# Patient Record
Sex: Female | Born: 1955 | Race: Black or African American | Hispanic: No | Marital: Married | Smoking: Never smoker
Health system: Southern US, Community
[De-identification: ages and names within clinical notes are randomized; demographics above are authoritative.]

## PROBLEM LIST (undated history)

## (undated) DIAGNOSIS — Z800 Family history of malignant neoplasm of digestive organs: Secondary | ICD-10-CM

## (undated) DIAGNOSIS — K5900 Constipation, unspecified: Secondary | ICD-10-CM

## (undated) DIAGNOSIS — I10 Essential (primary) hypertension: Secondary | ICD-10-CM

## (undated) DIAGNOSIS — R001 Bradycardia, unspecified: Secondary | ICD-10-CM

## (undated) DIAGNOSIS — K589 Irritable bowel syndrome without diarrhea: Secondary | ICD-10-CM

## (undated) DIAGNOSIS — R233 Spontaneous ecchymoses: Secondary | ICD-10-CM

## (undated) DIAGNOSIS — M2540 Effusion, unspecified joint: Secondary | ICD-10-CM

## (undated) DIAGNOSIS — F32A Depression, unspecified: Secondary | ICD-10-CM

## (undated) DIAGNOSIS — E669 Obesity, unspecified: Secondary | ICD-10-CM

## (undated) DIAGNOSIS — F329 Major depressive disorder, single episode, unspecified: Secondary | ICD-10-CM

## (undated) HISTORY — DX: Obesity, unspecified: E66.9

## (undated) HISTORY — PX: TUBAL LIGATION: SHX77

## (undated) HISTORY — DX: Constipation, unspecified: K59.00

## (undated) HISTORY — DX: Essential (primary) hypertension: I10

## (undated) HISTORY — DX: Spontaneous ecchymoses: R23.3

## (undated) HISTORY — PX: ABDOMINAL HYSTERECTOMY: SHX81

## (undated) HISTORY — PX: CHOLECYSTECTOMY: SHX55

## (undated) HISTORY — DX: Bradycardia, unspecified: R00.1

## (undated) HISTORY — DX: Family history of malignant neoplasm of digestive organs: Z80.0

## (undated) HISTORY — DX: Depression, unspecified: F32.A

## (undated) HISTORY — DX: Irritable bowel syndrome, unspecified: K58.9

## (undated) HISTORY — DX: Effusion, unspecified joint: M25.40

---
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
## 1983-05-21 HISTORY — PX: BREAST EXCISIONAL BIOPSY: SUR124

## 1997-11-04 ENCOUNTER — Ambulatory Visit (HOSPITAL_COMMUNITY): Admission: RE | Admit: 1997-11-04 | Discharge: 1997-11-04 | Payer: Self-pay | Admitting: Internal Medicine

## 1997-11-23 ENCOUNTER — Emergency Department (HOSPITAL_COMMUNITY): Admission: EM | Admit: 1997-11-23 | Discharge: 1997-11-23 | Payer: Self-pay | Admitting: Emergency Medicine

## 1998-04-21 ENCOUNTER — Other Ambulatory Visit: Admission: RE | Admit: 1998-04-21 | Discharge: 1998-04-21 | Payer: Self-pay | Admitting: Obstetrics & Gynecology

## 1998-08-16 ENCOUNTER — Emergency Department (HOSPITAL_COMMUNITY): Admission: EM | Admit: 1998-08-16 | Discharge: 1998-08-17 | Payer: Self-pay | Admitting: Emergency Medicine

## 1998-10-06 ENCOUNTER — Ambulatory Visit (HOSPITAL_COMMUNITY): Admission: RE | Admit: 1998-10-06 | Discharge: 1998-10-06 | Payer: Self-pay | Admitting: Internal Medicine

## 1998-10-06 ENCOUNTER — Encounter: Payer: Self-pay | Admitting: Internal Medicine

## 1999-11-09 ENCOUNTER — Encounter: Payer: Self-pay | Admitting: *Deleted

## 1999-11-10 ENCOUNTER — Inpatient Hospital Stay (HOSPITAL_COMMUNITY): Admission: EM | Admit: 1999-11-10 | Discharge: 1999-11-12 | Payer: Self-pay | Admitting: *Deleted

## 1999-11-11 ENCOUNTER — Encounter: Payer: Self-pay | Admitting: Internal Medicine

## 2000-11-28 ENCOUNTER — Encounter: Payer: Self-pay | Admitting: Internal Medicine

## 2000-11-28 ENCOUNTER — Ambulatory Visit (HOSPITAL_COMMUNITY): Admission: RE | Admit: 2000-11-28 | Discharge: 2000-11-28 | Payer: Self-pay | Admitting: Internal Medicine

## 2002-06-09 ENCOUNTER — Encounter: Admission: RE | Admit: 2002-06-09 | Discharge: 2002-06-09 | Payer: Self-pay | Admitting: Internal Medicine

## 2002-06-09 ENCOUNTER — Encounter: Payer: Self-pay | Admitting: Internal Medicine

## 2003-01-31 ENCOUNTER — Encounter: Payer: Self-pay | Admitting: Internal Medicine

## 2003-01-31 ENCOUNTER — Encounter (INDEPENDENT_AMBULATORY_CARE_PROVIDER_SITE_OTHER): Payer: Self-pay | Admitting: Specialist

## 2003-01-31 ENCOUNTER — Encounter: Admission: RE | Admit: 2003-01-31 | Discharge: 2003-01-31 | Payer: Self-pay | Admitting: Internal Medicine

## 2003-02-16 ENCOUNTER — Encounter: Admission: RE | Admit: 2003-02-16 | Discharge: 2003-02-16 | Payer: Self-pay | Admitting: Internal Medicine

## 2003-02-16 ENCOUNTER — Encounter: Payer: Self-pay | Admitting: Internal Medicine

## 2003-03-18 ENCOUNTER — Encounter: Admission: RE | Admit: 2003-03-18 | Discharge: 2003-03-18 | Payer: Self-pay | Admitting: Internal Medicine

## 2003-05-30 ENCOUNTER — Encounter: Admission: RE | Admit: 2003-05-30 | Discharge: 2003-05-30 | Payer: Self-pay | Admitting: Internal Medicine

## 2003-06-22 ENCOUNTER — Encounter: Admission: RE | Admit: 2003-06-22 | Discharge: 2003-06-22 | Payer: Self-pay | Admitting: Internal Medicine

## 2003-07-29 ENCOUNTER — Encounter: Admission: RE | Admit: 2003-07-29 | Discharge: 2003-07-29 | Payer: Self-pay | Admitting: Internal Medicine

## 2004-02-01 ENCOUNTER — Encounter: Admission: RE | Admit: 2004-02-01 | Discharge: 2004-02-01 | Payer: Self-pay | Admitting: Internal Medicine

## 2004-02-09 ENCOUNTER — Encounter: Admission: RE | Admit: 2004-02-09 | Discharge: 2004-02-09 | Payer: Self-pay | Admitting: Internal Medicine

## 2005-01-30 ENCOUNTER — Encounter: Admission: RE | Admit: 2005-01-30 | Discharge: 2005-01-30 | Payer: Self-pay | Admitting: Internal Medicine

## 2006-02-07 ENCOUNTER — Encounter: Admission: RE | Admit: 2006-02-07 | Discharge: 2006-02-07 | Payer: Self-pay | Admitting: Obstetrics and Gynecology

## 2006-11-02 ENCOUNTER — Emergency Department: Payer: Self-pay | Admitting: Emergency Medicine

## 2007-02-10 ENCOUNTER — Encounter: Admission: RE | Admit: 2007-02-10 | Discharge: 2007-02-10 | Payer: Self-pay | Admitting: Internal Medicine

## 2008-02-11 ENCOUNTER — Encounter: Admission: RE | Admit: 2008-02-11 | Discharge: 2008-02-11 | Payer: Self-pay | Admitting: Obstetrics and Gynecology

## 2008-06-25 ENCOUNTER — Emergency Department (HOSPITAL_COMMUNITY): Admission: EM | Admit: 2008-06-25 | Discharge: 2008-06-25 | Payer: Self-pay | Admitting: Emergency Medicine

## 2009-12-01 ENCOUNTER — Encounter: Admission: RE | Admit: 2009-12-01 | Discharge: 2009-12-01 | Payer: Self-pay | Admitting: Internal Medicine

## 2010-06-10 ENCOUNTER — Encounter: Payer: Self-pay | Admitting: Internal Medicine

## 2010-09-04 LAB — BASIC METABOLIC PANEL
BUN: 9 mg/dL (ref 6–23)
CO2: 31 mEq/L (ref 19–32)
Calcium: 10 mg/dL (ref 8.4–10.5)
Chloride: 103 mEq/L (ref 96–112)
Creatinine, Ser: 0.69 mg/dL (ref 0.4–1.2)
GFR calc Af Amer: >60 mL/min (ref >60)
GFR calc non Af Amer: >60 mL/min (ref >60)
Glucose, Bld: 98 mg/dL (ref 70–99)
Potassium: 3.7 mEq/L (ref 3.5–5.1)
Sodium: 140 mEq/L (ref 135–145)

## 2010-09-04 LAB — CBC
HCT: 38.4 % (ref 36.0–46.0)
Hemoglobin: 12.9 g/dL (ref 12.0–15.0)
MCHC: 33.5 g/dL (ref 30.0–36.0)
MCV: 92.2 fL (ref 78.0–100.0)
Platelets: 192 10*3/uL (ref 150–400)
RBC: 4.17 MIL/uL (ref 3.87–5.11)
RDW: 13.5 % (ref 11.5–15.5)
WBC: 6.6 10*3/uL (ref 4.0–10.5)

## 2010-09-04 LAB — DIFFERENTIAL
Basophils Absolute: 0 10*3/uL (ref 0.0–0.1)
Basophils Relative: 0 % (ref 0–1)
Eosinophils Absolute: 0.1 10*3/uL (ref 0.0–0.7)
Eosinophils Relative: 1 % (ref 0–5)
Lymphocytes Relative: 24 % (ref 12–46)
Lymphs Abs: 1.6 10*3/uL (ref 0.7–4.0)
Monocytes Absolute: 0.8 10*3/uL (ref 0.1–1.0)
Monocytes Relative: 11 % (ref 3–12)
Neutro Abs: 4.2 10*3/uL (ref 1.7–7.7)
Neutrophils Relative %: 63 % (ref 43–77)

## 2010-09-04 LAB — CK TOTAL AND CKMB (NOT AT ARMC)
CK, MB: 2.3 ng/mL (ref 0.3–4.0)
Relative Index: 1.7 (ref 0.0–2.5)
Total CK: 133 U/L (ref 7–177)

## 2010-09-04 LAB — TROPONIN I: Troponin I: <0.01 ng/mL (ref 0.00–0.06)

## 2010-10-05 NOTE — Cardiovascular Report (Signed)
[place]. [facility]  Patient:    Ashley Conway, Ashley Conway                      MRN: [id number] Proc. Date: 11/12/99 Adm. Date:  [id number] Attending:  Rosanne Sack CC:         Marinda Elk, M.D.             Rosanne Sack, M.D.             Cardiac Catheterization Laboratory                        Cardiac Catheterization  CINE NUMBER:  [id number]  PROCEDURES PERFORMED:  Coronary angiography and left heart catheterization.  INDICATIONS:  Atypical chest pain in conjunction with an abnormal Cardiolite study (low ejection fraction and possible anterior ischemia).  DESCRIPTION OF PROCEDURE:  After informed consent, a 6 French sheath was inserted into the right femoral artery using a modified Seldinger technique. A 6 French A2 multipurpose catheter was used for hemodynamic recordings, left ventriculography, and selective right and left coronary angiography.  The patient tolerated the procedure without complications.  RESULTS:  I:  HEMODYNAMIC DATA:     a. The aortic pressure was 174/91 mmHg.     b. Left ventricular pressure 174/14 mmHg.  II:  LEFT VENTRICULOGRAPHY:  Left ventriculography by power injection resulted in a ventricular tachy gram.  Hand injection subsequently demonstrated symmetrical LV wall motion and an estimated ejection fraction in the 55-65% range.  No mitral regurgitation was noted.  III:  SELECTIVE CORONARY ANGIOGRAPHY:     a. Left main:  The left main coronary artery is normal.     b. Left anterior descending coronary artery:  The left anterior        descending coronary artery is a large vessel that wraps around        the left ventricular apex.  It gives origin to three diagonal branches,        all of which were normal.  The LAD is normal.     c. Circumflex artery:  The circumflex artery is large giving origin        to three obtuse marginal branches and is normal.     d. Ramus intermedius branch:  A large ramus branch arises  from the left        main and is normal.     e. Right coronary artery:  The right coronary artery is normal.  CONCLUSIONS: 1. Normal coronary arteries. 2. Normal left ventricular function. 3. Abnormal Cardiolite, false-positive.  RECOMMENDATIONS:  No further cardiac evaluation. DD:  11/12/99 TD:  11/13/99 Job: [id number]

## 2010-10-05 NOTE — H&P (Signed)
[place]. [facility]  Patient:    Ashley, Conway                      MRN: [id number] Adm. Date:  [id number] Attending:  Lenora Boys                         History and Physical  DATE OF BIRTH: [birth year]  PROBLEM:  Chest pain.  HISTORY OF PRESENT ILLNESS:  The patient is a 55 year old married black female who had onset of chest pain at 9:30 on evening of admission.  She was riding in a car with no exertion.  Pain was 7 out of 10 in intensity, dull and left precordial.  She had a little radiation toward the right, but none toward the arm or neck.  Pain was severe for about 10 minutes, but persisted for 2-1/2 hours and her husband brought her to the emergency room for evaluation.  She had some associated sweats which she cannot be sure were not a hot flash, but had no associated shortness of breath or palpitations.  She has had similar pains before, but not as severe or prolonged.  CURRENT MEDICATIONS:  Senestin for HRT.  ALLERGIES:  None.  PAST MEDICAL HISTORY:  She has had diagnosis of irritable bowel syndrome hospitalized in 1989, gallbladder removal in 1998, hysterectomy for bleeding.  FAMILY HISTORY:  Mother died at 38 of a massive heart attack.  Father had hypertension and had a heart attack and died at 37.  The patient has a 107 year old sister with hypertension, 36 year old sister alive and well, 79 year old sister with hypertension, a twin 28 year old brother who is being treated for hypertension and 61- and 34 year old brothers both with hypertension.  Five of seven children in her family are under treatment for hypertension.  No cancer in family.  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  She walks for exercise once a week or more.  Nonsmoker.  No ETOH.  No cocaine use.  REVIEW OF SYSTEMS:  The patient does get headaches and has some trouble reading, more so recently.  ENT:  Review negative.  GI:  Some  intermittent abdominal pain and alternating constipation/diarrhea, more on the constipated side.  MS:  Does get joint pains without swelling or redness.  SKIN: Negative.  PSYCHOLOGICAL:  Negative.  PHYSICAL EXAMINATION:  VITAL SIGNS:  Temperature 97.5, pulse 53, respirations 22, blood pressure 173/101.  CONSTITUTIONAL:  The patient is in no acute distress.  HEENT:  Eyes:  EOMI, PERL.  ENT:  Normal EACs and TMs.  Oral mucosa normal.  NECK:  Supple.  No thyromegaly or bruits.  CHEST:  Clear, nontender.  BREASTS:  Without masses or tenderness.  HEART:  Heart rate regular.  No murmur, rub, or gallop.  The heart sounds are normal.  ABDOMEN:  Mildly obese, nontender.  No HSM or masses.  GENITAL:  Deferred.  RECTAL:  Deferred.  EXTREMITIES:  No clubbing, cyanosis, or edema.  Pulses were difficult to feel in the popliteal space and right DP, but did feel DP on left and posterior tibials bilaterally.  SKIN:  Clear.  LABORATORY DATA:  EKG showed sinus bradycardia with no ischemic changes.  CBC:  WBC 5.8, hemoglobin 11.7.  Chemistry:  Normal with sodium 143, potassium 3.5, creatinine 0.9 and glucose 103.  Initial CK 206 with MB 3.0. Initial troponin I 0.05.  ASSESSMENT:  Chest pain, will need  to rule out myocardial infarction given her family history especially with mothers premature heart attack.  Will get serial enzymes and EKG.  If all are negative, patient will be cleared for follow up with her primary care physician in [place]. DD:  11/10/99 TD:  11/10/99 Job: [id number]

## 2010-11-27 ENCOUNTER — Other Ambulatory Visit: Payer: Self-pay | Admitting: Internal Medicine

## 2010-11-27 DIAGNOSIS — Z1231 Encounter for screening mammogram for malignant neoplasm of breast: Secondary | ICD-10-CM

## 2010-12-05 ENCOUNTER — Ambulatory Visit
Admission: RE | Admit: 2010-12-05 | Discharge: 2010-12-05 | Disposition: A | Discharge status: Home / Self Care | Payer: Self-pay | Source: Ambulatory Visit | Attending: Internal Medicine | Admitting: Internal Medicine

## 2010-12-05 DIAGNOSIS — Z1231 Encounter for screening mammogram for malignant neoplasm of breast: Secondary | ICD-10-CM

## 2011-05-30 ENCOUNTER — Emergency Department (HOSPITAL_COMMUNITY)
Admission: EM | Admit: 2011-05-30 | Discharge: 2011-05-30 | Disposition: A | Discharge status: Home / Self Care | Payer: Self-pay | Attending: Emergency Medicine | Admitting: Emergency Medicine

## 2011-05-30 ENCOUNTER — Encounter (HOSPITAL_COMMUNITY): Payer: Self-pay | Admitting: *Deleted

## 2011-05-30 DIAGNOSIS — L408 Other psoriasis: Secondary | ICD-10-CM | POA: Insufficient documentation

## 2011-05-30 DIAGNOSIS — L409 Psoriasis, unspecified: Secondary | ICD-10-CM

## 2011-05-30 DIAGNOSIS — I10 Essential (primary) hypertension: Secondary | ICD-10-CM | POA: Insufficient documentation

## 2011-05-30 HISTORY — DX: Essential (primary) hypertension: I10

## 2011-05-30 MED ORDER — PREDNISONE 10 MG PO TABS: 10.0000 mg | ORAL_TABLET | Freq: Two times a day (BID) | ORAL | Status: DC

## 2011-05-30 NOTE — ED Provider Notes (Signed)
Medical screening examination/treatment/procedure(s) were performed by non-physician practitioner and as supervising physician I was immediately available for consultation/collaboration.  Donnetta Hutching, MD 05/30/11 1529

## 2011-05-30 NOTE — ED Provider Notes (Signed)
History     CSN: [id number]  Arrival date & time 05/30/11  8469   First MD Initiated Contact with Patient 05/30/11 1038      Chief Complaint  Patient presents with  . Rash    (Consider location/radiation/quality/duration/timing/severity/associated sxs/prior treatment) HPI  Pt presents to the ED with complaints of rash. She has seen a Dermatologist twice a couple months ago for the rash but it has not alleviated. She was given triamcinolone cream and does not remember what she was diagnosed with. She states that the rash itches, then cracks, then burns afterwards. She denies systemic symptoms such as weight loss, fevers, chills, N/V/D. She is out of the cream she was using but does not believe that it helped.  pts past medical hx is positive for which she takes Lisinopril. No new/changes in medications.  Past Medical History  Diagnosis Date  . Hypertension     Past Surgical History  Procedure Date  . Tubal ligation   . Abdominal hysterectomy   . Cholecystectomy     No family history on file.  History  Substance Use Topics  . Smoking status: Never Smoker   . Smokeless tobacco: Not on file  . Alcohol Use: No    OB History    Grav Para Term Preterm Abortions TAB SAB Ect Mult Living                  Review of Systems  All other systems reviewed and are negative.    Allergies  Review of patient's allergies indicates no known allergies.  Home Medications   Current Outpatient Rx  Name Route Sig Dispense Refill  . LISINOPRIL-HYDROCHLOROTHIAZIDE 20-12.5 MG PO TABS Oral Take 1 tablet by mouth daily.    Marland Kitchen PREDNISONE 10 MG PO TABS Oral Take 1 tablet (10 mg total) by mouth 2 (two) times daily. 30 tablet 0    BP 136/76  Pulse 53  Temp(Src) 97.7 F (36.5 C) (Oral)  Resp 17  Wt 170 lb (77.111 kg)  SpO2 98%  Physical Exam  Nursing note and vitals reviewed. Constitutional: She appears well-developed and well-nourished.  HENT:  Head: Normocephalic and atraumatic.   Eyes: Conjunctivae are normal. Pupils are equal, round, and reactive to light.  Neck: Trachea normal, normal range of motion and full passive range of motion without pain. Neck supple.  Cardiovascular: Normal rate, regular rhythm and normal pulses.   Pulmonary/Chest: Effort normal and breath sounds normal. Chest wall is not dull to percussion. She exhibits no crepitus, no edema, no deformity and no retraction.  Abdominal: Soft. Normal appearance and bowel sounds are normal.  Musculoskeletal: Normal range of motion.  Lymphadenopathy:       Head (right side): No submental, no submandibular, no tonsillar, no preauricular, no posterior auricular and no occipital adenopathy present.       Head (left side): No submental, no submandibular, no tonsillar, no preauricular, no posterior auricular and no occipital adenopathy present.    She has no axillary adenopathy.  Neurological: She is alert. She has normal strength.  Skin: Skin is warm, dry and intact. Rash (rash pattern is on the anterior and post portion of the anticubital fossa. Rash found over the umbilicus, on the proxmal shin on the left leg. The skin is scaley and excoriated with darkening of pigmentation in the central portion. No central cleaning. ) noted.  Psychiatric: She has a normal mood and affect. Her speech is normal and behavior is normal. Judgment and thought content normal.  Cognition and memory are normal.  NO induration, tenderness of expression of puss to palpation.  ED Course  Procedures (including critical care time)  Labs Reviewed - No data to display No results found.   1. Psoriasis       MDM  Pt given a PO trial of prednisone and instructed to follow-up with Dermatology       Dorthula Matas, PA 05/30/11 1513

## 2011-05-30 NOTE — ED Notes (Signed)
Pt states "I have this rash on my left leg, went to the dermatologist, probably last Mar or April, have a rash to my arms, something trying to come up on my belly"

## 2011-12-28 ENCOUNTER — Emergency Department (HOSPITAL_COMMUNITY)
Admission: EM | Admit: 2011-12-28 | Discharge: 2011-12-28 | Disposition: A | Discharge status: Home / Self Care | Payer: Self-pay | Attending: Emergency Medicine | Admitting: Emergency Medicine

## 2011-12-28 ENCOUNTER — Emergency Department (HOSPITAL_COMMUNITY): Payer: Self-pay

## 2011-12-28 ENCOUNTER — Encounter (HOSPITAL_COMMUNITY): Payer: Self-pay | Admitting: Emergency Medicine

## 2011-12-28 DIAGNOSIS — Z79899 Other long term (current) drug therapy: Secondary | ICD-10-CM | POA: Insufficient documentation

## 2011-12-28 DIAGNOSIS — R109 Unspecified abdominal pain: Secondary | ICD-10-CM | POA: Insufficient documentation

## 2011-12-28 DIAGNOSIS — I10 Essential (primary) hypertension: Secondary | ICD-10-CM | POA: Insufficient documentation

## 2011-12-28 LAB — URINALYSIS, ROUTINE W REFLEX MICROSCOPIC
Glucose, UA: NEGATIVE mg/dL
Leukocytes, UA: NEGATIVE
Protein, ur: NEGATIVE mg/dL
Specific Gravity, Urine: 1.015 (ref 1.005–1.030)
Urobilinogen, UA: 1 mg/dL (ref 0.0–1.0)

## 2011-12-28 LAB — CBC WITH DIFFERENTIAL/PLATELET
Basophils Relative: 0 % (ref 0–1)
Eosinophils Absolute: 0.1 10*3/uL (ref 0.0–0.7)
Eosinophils Relative: 1 % (ref 0–5)
HCT: 39.9 % (ref 36.0–46.0)
Hemoglobin: 13.4 g/dL (ref 12.0–15.0)
MCH: 30.1 pg (ref 26.0–34.0)
MCHC: 33.6 g/dL (ref 30.0–36.0)
MCV: 89.7 fL (ref 78.0–100.0)
Monocytes Absolute: 0.6 10*3/uL (ref 0.1–1.0)
Monocytes Relative: 10 % (ref 3–12)
Neutro Abs: 3.9 10*3/uL (ref 1.7–7.7)
RDW: 13.6 % (ref 11.5–15.5)

## 2011-12-28 LAB — COMPREHENSIVE METABOLIC PANEL
Albumin: 4.2 g/dL (ref 3.5–5.2)
BUN: 11 mg/dL (ref 6–23)
Calcium: 9.8 mg/dL (ref 8.4–10.5)
Chloride: 101 mEq/L (ref 96–112)
Creatinine, Ser: 0.61 mg/dL (ref 0.50–1.10)
Total Bilirubin: 0.4 mg/dL (ref 0.3–1.2)

## 2011-12-28 MED ORDER — SODIUM CHLORIDE 0.9 % IV BOLUS (SEPSIS)
500.0000 mL | Freq: Once | INTRAVENOUS | Status: AC
  Administered 2011-12-28: 500 mL via INTRAVENOUS

## 2011-12-28 MED ORDER — HYDROMORPHONE HCL PF 1 MG/ML IJ SOLN
1.0000 mg | Freq: Once | INTRAMUSCULAR | Status: AC
  Administered 2011-12-28: 1 mg via INTRAVENOUS
  Filled 2011-12-28: qty 1

## 2011-12-28 MED ORDER — ONDANSETRON HCL 4 MG/2ML IJ SOLN
4.0000 mg | Freq: Once | INTRAMUSCULAR | Status: AC
  Administered 2011-12-28: 4 mg via INTRAVENOUS
  Filled 2011-12-28: qty 2

## 2011-12-28 MED ORDER — SODIUM CHLORIDE 0.9 % IV BOLUS (SEPSIS)
1000.0000 mL | Freq: Once | INTRAVENOUS | Status: AC
  Administered 2011-12-28: 1000 mL via INTRAVENOUS

## 2011-12-28 NOTE — ED Notes (Signed)
Family members came to visit patient and is demanding for an answer for pt's abdominal pain and low HR. Dr. Denton Lank left already, will inform Dr. Silverio Lay.

## 2011-12-28 NOTE — ED Provider Notes (Signed)
Nurse called me regarding the patient. Patient was ready for DC but still has abdominal pain and HR 40s. As per patient, the abdominal pain improved but she is still nauseous. Able to tolerate PO. As per patient, her HR has been low in the past. She feels chronically dizzy but no syncope. She had a Holter monitor in the past that showed bradycardia. She never had a stress test or echo. I discussed the lab results and CT with the patient. I recommended stool softeners, high fiber diet. I also recommend that she follow up with a cardiologist to get an outpatient stress test and echo. Patient is stable to d/c. She understands that if she passes out, have chest pain, severe abdominal pain, she will return to ED.   Richardean Canal, MD 12/28/11 [phone]

## 2011-12-28 NOTE — ED Notes (Signed)
Pt's HR was 49 upon Discharge while her initial HR on arrival was 70. Pt c/o dizziness and nasuea. EDP informed, ordered another bag of NS bolus, food, then move around in room, then discharge afterward. Pt still have IV intact. Bolus given

## 2011-12-28 NOTE — ED Notes (Signed)
After NS Bolus, food and ambulation, pt still feeling dizzy and nausea. Dr. Denton Lank left, Dr. Silverio Lay reviewed pt's information is okay discharging this patient, sts to have pt follow up with pcp.

## 2011-12-28 NOTE — ED Notes (Signed)
Dr. Silverio Lay explained to patient that she has to follow the bradycardiac with her PCP.

## 2011-12-28 NOTE — ED Notes (Signed)
Pt states onset of lower abdominal pain onset yesterday, worsened during the noc and unable to sleep. Denies dysuria, hematuria, diarrhea or vomiting. Last BM yesterday smaller then normal.

## 2011-12-28 NOTE — ED Provider Notes (Signed)
History     CSN: [id number]  Arrival date & time 12/28/11  1230   First MD Initiated Contact with Patient 12/28/11 1258      Chief Complaint  Patient presents with  . Abdominal Pain    (Consider location/radiation/quality/duration/timing/severity/associated sxs/prior treatment) The history is provided by the patient.  pt c/o lower abdominal pain for past day. Crampy, constant.  Moderate.  No specific exacerbating or alleviating factors. Pain does not radiate. States similar pain intermittently 'for long time now' - months per pt. Denies prior specific dx for same but was told possibly due to 'scar tissue' from prior abdominal surgeries. Remote hx cholecystectomy and hysterectomy. Denies back or flank pain. No gu c/o. No vaginal bleeding or discharge. Normal appetite. No nv. Having normal bms, incl today, no diarrhea or constipation. No fever/chills.    Past Medical History  Diagnosis Date  . Hypertension     Past Surgical History  Procedure Date  . Tubal ligation   . Abdominal hysterectomy   . Cholecystectomy     No family history on file.  History  Substance Use Topics  . Smoking status: Never Smoker   . Smokeless tobacco: Never Used  . Alcohol Use: No    OB History    Grav Para Term Preterm Abortions TAB SAB Ect Mult Living                  Review of Systems  Constitutional: Negative for fever and chills.  HENT: Negative for neck pain.   Eyes: Negative for redness.  Respiratory: Negative for cough and shortness of breath.   Cardiovascular: Negative for chest pain.  Gastrointestinal: Positive for abdominal pain.  Genitourinary: Negative for dysuria and flank pain.  Musculoskeletal: Negative for back pain.  Skin: Negative for rash.  Neurological: Negative for headaches.  Hematological: Does not bruise/bleed easily.  Psychiatric/Behavioral: Negative for confusion.    Allergies  Review of patient's allergies indicates no known allergies.  Home Medications     Current Outpatient Rx  Name Route Sig Dispense Refill  . LISINOPRIL-HYDROCHLOROTHIAZIDE 20-12.5 MG PO TABS Oral Take 1 tablet by mouth daily.      BP 125/81  Pulse 70  Temp 98.6 F (37 C) (Oral)  Wt 176 lb (79.833 kg)  SpO2 97%  Physical Exam  Nursing note and vitals reviewed. Constitutional: She appears well-developed and well-nourished. No distress.  Eyes: Conjunctivae are normal. No scleral icterus.  Neck: Neck supple. No tracheal deviation present.  Cardiovascular: Normal rate, regular rhythm, normal heart sounds and intact distal pulses.   Pulmonary/Chest: Effort normal and breath sounds normal. No respiratory distress.  Abdominal: Soft. Normal appearance and bowel sounds are normal. She exhibits no distension and no mass. There is no tenderness. There is no rebound and no guarding.       No incarc hernia.   Genitourinary:       No cva tenderness  Musculoskeletal: She exhibits no edema.  Neurological: She is alert.  Skin: Skin is warm and dry. No rash noted.  Psychiatric: She has a normal mood and affect.    ED Course  Procedures (including critical care time)   Labs Reviewed  CBC WITH DIFFERENTIAL  COMPREHENSIVE METABOLIC PANEL  URINALYSIS, ROUTINE W REFLEX MICROSCOPIC   Results for orders placed during the hospital encounter of 12/28/11  CBC WITH DIFFERENTIAL      Component Value Range   WBC 6.1  4.0 - 10.5 K/uL   RBC 4.45  3.87 - 5.11 MIL/uL  Hemoglobin 13.4  12.0 - 15.0 g/dL   HCT 11.9  14.7 - 82.9 %   MCV 89.7  78.0 - 100.0 fL   MCH 30.1  26.0 - 34.0 pg   MCHC 33.6  30.0 - 36.0 g/dL   RDW 56.2  13.0 - 86.5 %   Platelets 226  150 - 400 K/uL   Neutrophils Relative 64  43 - 77 %   Neutro Abs 3.9  1.7 - 7.7 K/uL   Lymphocytes Relative 25  12 - 46 %   Lymphs Abs 1.5  0.7 - 4.0 K/uL   Monocytes Relative 10  3 - 12 %   Monocytes Absolute 0.6  0.1 - 1.0 K/uL   Eosinophils Relative 1  0 - 5 %   Eosinophils Absolute 0.1  0.0 - 0.7 K/uL   Basophils  Relative 0  0 - 1 %   Basophils Absolute 0.0  0.0 - 0.1 K/uL  COMPREHENSIVE METABOLIC PANEL      Component Value Range   Sodium 137  135 - 145 mEq/L   Potassium 3.8  3.5 - 5.1 mEq/L   Chloride 101  96 - 112 mEq/L   CO2 26  19 - 32 mEq/L   Glucose, Bld 100 (*) 70 - 99 mg/dL   BUN 11  6 - 23 mg/dL   Creatinine, Ser 7.84  0.50 - 1.10 mg/dL   Calcium 9.8  8.4 - 69.6 mg/dL   Total Protein 7.1  6.0 - 8.3 g/dL   Albumin 4.2  3.5 - 5.2 g/dL   AST 21  0 - 37 U/L   ALT 17  0 - 35 U/L   Alkaline Phosphatase 67  39 - 117 U/L   Total Bilirubin 0.4  0.3 - 1.2 mg/dL   GFR calc non Af Amer >90  >90 mL/min   GFR calc Af Amer >90  >90 mL/min  URINALYSIS, ROUTINE W REFLEX MICROSCOPIC      Component Value Range   Color, Urine YELLOW  YELLOW   APPearance CLEAR  CLEAR   Specific Gravity, Urine 1.015  1.005 - 1.030   pH 8.0  5.0 - 8.0   Glucose, UA NEGATIVE  NEGATIVE mg/dL   Hgb urine dipstick NEGATIVE  NEGATIVE   Bilirubin Urine NEGATIVE  NEGATIVE   Ketones, ur NEGATIVE  NEGATIVE mg/dL   Protein, ur NEGATIVE  NEGATIVE mg/dL   Urobilinogen, UA 1.0  0.0 - 1.0 mg/dL   Nitrite NEGATIVE  NEGATIVE   Leukocytes, UA NEGATIVE  NEGATIVE   Dg Abd 1 View  12/28/2011  *RADIOLOGY REPORT*  Clinical Data: Abdominal pain  ABDOMEN - 1 VIEW  Comparison: Small bowel follow-through - 05/24/2005; CT abdomen - 05/17/2005  Findings:  Moderate to large colonic stool without evidence of obstruction. Multiple phleboliths overlie the pelvis.  No abnormal intra- abdominal calcifications.  No supine evidence of pneumoperitoneum. No definite pneumatosis or portal venous gas.  There is mild scoliotic curvature of the thoracolumbar spine, possibly positional.  Degenerative change of the lower lumbar spine is suspected.  Grossly unchanged bone island within the medial aspect of the left ilium.  IMPRESSION: Moderate to large colonic stool without evidence of obstruction.  Original Report Authenticated By: Waynard Reeds, M.D.       MDM  Iv ns. Dilaudid 1 mg iv. zofran iv.   Recheck no nv. abd soft nt. Discussed labs/xrays w pt. Will give abd pain instructions and close pcp follow up.  Suzi Roots, MD 12/28/11 [phone]

## 2012-02-28 ENCOUNTER — Other Ambulatory Visit: Payer: Self-pay | Admitting: Internal Medicine

## 2012-02-28 DIAGNOSIS — Z1231 Encounter for screening mammogram for malignant neoplasm of breast: Secondary | ICD-10-CM

## 2012-03-31 ENCOUNTER — Ambulatory Visit
Admission: RE | Admit: 2012-03-31 | Discharge: 2012-03-31 | Disposition: A | Discharge status: Home / Self Care | Payer: Self-pay | Source: Ambulatory Visit | Attending: Internal Medicine | Admitting: Internal Medicine

## 2012-03-31 DIAGNOSIS — Z1231 Encounter for screening mammogram for malignant neoplasm of breast: Secondary | ICD-10-CM

## 2013-03-05 ENCOUNTER — Other Ambulatory Visit: Payer: Self-pay

## 2013-03-05 DIAGNOSIS — Z1231 Encounter for screening mammogram for malignant neoplasm of breast: Secondary | ICD-10-CM

## 2013-04-06 ENCOUNTER — Ambulatory Visit: Payer: Self-pay

## 2013-05-21 ENCOUNTER — Emergency Department (HOSPITAL_COMMUNITY)
Admission: EM | Admit: 2013-05-21 | Discharge: 2013-05-21 | Disposition: A | Discharge status: Home / Self Care | Payer: Self-pay | Attending: Emergency Medicine | Admitting: Emergency Medicine

## 2013-05-21 ENCOUNTER — Encounter (HOSPITAL_COMMUNITY): Payer: Self-pay | Admitting: Emergency Medicine

## 2013-05-21 ENCOUNTER — Emergency Department (HOSPITAL_COMMUNITY): Payer: Self-pay

## 2013-05-21 DIAGNOSIS — W010XXA Fall on same level from slipping, tripping and stumbling without subsequent striking against object, initial encounter: Secondary | ICD-10-CM | POA: Insufficient documentation

## 2013-05-21 DIAGNOSIS — Y9301 Activity, walking, marching and hiking: Secondary | ICD-10-CM | POA: Insufficient documentation

## 2013-05-21 DIAGNOSIS — W19XXXA Unspecified fall, initial encounter: Secondary | ICD-10-CM

## 2013-05-21 DIAGNOSIS — S0990XA Unspecified injury of head, initial encounter: Secondary | ICD-10-CM | POA: Insufficient documentation

## 2013-05-21 DIAGNOSIS — Z79899 Other long term (current) drug therapy: Secondary | ICD-10-CM | POA: Insufficient documentation

## 2013-05-21 DIAGNOSIS — M549 Dorsalgia, unspecified: Secondary | ICD-10-CM

## 2013-05-21 DIAGNOSIS — I10 Essential (primary) hypertension: Secondary | ICD-10-CM | POA: Insufficient documentation

## 2013-05-21 DIAGNOSIS — IMO0002 Reserved for concepts with insufficient information to code with codable children: Secondary | ICD-10-CM | POA: Insufficient documentation

## 2013-05-21 DIAGNOSIS — Y9229 Other specified public building as the place of occurrence of the external cause: Secondary | ICD-10-CM | POA: Insufficient documentation

## 2013-05-21 MED ORDER — TRAMADOL HCL 50 MG PO TABS
50.0000 mg | ORAL_TABLET | Freq: Four times a day (QID) | ORAL | Status: DC | PRN
Start: 2013-05-21 — End: 2019-04-08

## 2013-05-21 NOTE — ED Notes (Signed)
[place] mall [place]; pt states slipped in water and fell; ems called and checked out pt at that time--refused transport; states bp was elevated; c/o back pain from fall

## 2013-05-21 NOTE — ED Notes (Signed)
Pt ambulatory to exam room with steady gait.  

## 2013-05-21 NOTE — ED Provider Notes (Signed)
CSN: [id number]     Arrival date & time 05/21/13  1948 History  This chart was scribed for non-physician practitioner Emilia Beck, PA-C working with Rolland Porter, MD by Joaquin Music, ED Scribe. This patient was seen in room WTR7/WTR7 and the patient's care was started at 9:56 PM .   Chief Complaint  Patient presents with  . Fall   The history is provided by the patient. No language interpreter was used.   HPI Comments: Ashley Conway is a 58 y.o. female who presents to the Emergency Department complaining of lower back pain and back head pain due to a fall that occurred 4 hours ago . Pt states she slipped on a small puddle [place] in the mall this [place]. She states she was informed her BP was elevated by EMS that arrived at the mall. Pt states she landed on her lower back. She states the fall took place very fast and states she is unsure if she may have injured herself. Pt states she has been walking normal but states she is sore. Pt denies LOC.   Past Medical History  Diagnosis Date  . Hypertension    Past Surgical History  Procedure Laterality Date  . Tubal ligation    . Abdominal hysterectomy    . Cholecystectomy     No family history on file. History  Substance Use Topics  . Smoking status: Never Smoker   . Smokeless tobacco: Never Used  . Alcohol Use: No   OB History   Grav Para Term Preterm Abortions TAB SAB Ect Mult Living                 Review of Systems  Musculoskeletal: Positive for back pain.  Neurological: Negative for weakness.  All other systems reviewed and are negative.   Allergies  Review of patient's allergies indicates no known allergies.  Home Medications   Current Outpatient Rx  Name  Route  Sig  Dispense  Refill  . lisinopril-hydrochlorothiazide (PRINZIDE,ZESTORETIC) 20-12.5 MG per tablet   Oral   Take 1 tablet by mouth daily.          BP 179/81  Pulse 68  Temp(Src) 97.9 F (36.6 C) (Oral)  Resp 20  Wt 194 lb  (87.998 kg)  SpO2 99%  Physical Exam  Nursing note and vitals reviewed. Constitutional: She is oriented to person, place, and time. She appears well-developed and well-nourished. No distress.  HENT:  Head: Normocephalic and atraumatic.  Eyes: EOM are normal.  Neck: Neck supple. No tracheal deviation present.  Cardiovascular: Normal rate.   Pulmonary/Chest: Effort normal. No respiratory distress.  Musculoskeletal: Normal range of motion.  L4-L5 tenderness to palpitation. No paraspinal tenderness.   Neurological: She is alert and oriented to person, place, and time.  Extremity strength and sensation equal and intact.  Skin: Skin is warm and dry.  Psychiatric: She has a normal mood and affect. Her behavior is normal.    ED Course  Procedures  DIAGNOSTIC STUDIES: Oxygen Saturation is 99% on RA, normal by my interpretation.    COORDINATION OF CARE: 10:00 PM-Discussed treatment plan which includes X-Ray. Pt agreed to plan.   Labs Review Labs Reviewed - No data to display Imaging Review Dg Lumbar Spine Complete  05/21/2013   CLINICAL DATA:  Fall with lower back pain  EXAM: LUMBAR SPINE - COMPLETE 4+ VIEW  COMPARISON:  Abdominal CT 08/13/2012  FINDINGS: No vertebral body compression or acute subluxation. Apparent lucency through the right lower corner  of the L2 body (on oblique imaging) is most likely projectional/artifactual given the other images. Lower lumbar degenerative disc narrowing with low grade 1 anterolisthesis at L4-5 related to advanced facet osteoarthritis. Large bone island in the left ilium.  IMPRESSION: 1. Negative for acute fracture. 2. 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   Electronically Signed   By: Tiburcio PeaJonathan  Watts M.D.   On: 05/21/2013 22:44    EKG Interpretation   None      MDM   1. Fall, initial encounter   2. Back pain    10:50 PM Xray unremarkable for acute changes. Patient likely has muscle pain or bruise. No saddle paresthesias or  bladder/bowel incontinence. Patient is able to ambulate without difficulty. Patient will be discharged with Tramadol for pain.   I personally performed the services described in this documentation, which was scribed in my presence. The recorded information has been reviewed and is accurate.    Emilia BeckKaitlyn Cigi Bega, PA-C 05/21/13 2251

## 2013-05-21 NOTE — ED Notes (Signed)
Pt not in [facility] when called

## 2013-05-21 NOTE — Discharge Instructions (Signed)
Take Tramdol as needed for pain. Apply ice to your injury. Refer to attached documents for more information. Return to the ED with worsening or concerning symptoms.

## 2013-05-31 NOTE — ED Provider Notes (Signed)
Medical screening examination/treatment/procedure(s) were performed by non-physician practitioner and as supervising physician I was immediately available for consultation/collaboration.  EKG Interpretation   None         Breelynn Bankert, MD 05/31/13 0755 

## 2013-08-25 ENCOUNTER — Ambulatory Visit
Admission: RE | Admit: 2013-08-25 | Discharge: 2013-08-25 | Disposition: A | Discharge status: Home / Self Care | Payer: BC Managed Care – PPO | Source: Ambulatory Visit

## 2013-08-25 DIAGNOSIS — Z1231 Encounter for screening mammogram for malignant neoplasm of breast: Secondary | ICD-10-CM

## 2013-10-19 ENCOUNTER — Other Ambulatory Visit: Payer: Self-pay | Admitting: Internal Medicine

## 2013-10-19 DIAGNOSIS — N63 Unspecified lump in unspecified breast: Secondary | ICD-10-CM

## 2013-10-27 ENCOUNTER — Ambulatory Visit
Admission: RE | Admit: 2013-10-27 | Discharge: 2013-10-27 | Disposition: A | Discharge status: Home / Self Care | Payer: BC Managed Care – PPO | Source: Ambulatory Visit | Attending: Internal Medicine | Admitting: Internal Medicine

## 2013-10-27 ENCOUNTER — Encounter (INDEPENDENT_AMBULATORY_CARE_PROVIDER_SITE_OTHER): Payer: Self-pay

## 2013-10-27 DIAGNOSIS — N63 Unspecified lump in unspecified breast: Secondary | ICD-10-CM

## 2015-01-02 ENCOUNTER — Other Ambulatory Visit: Payer: Self-pay

## 2015-01-05 ENCOUNTER — Other Ambulatory Visit: Payer: Self-pay

## 2015-01-05 DIAGNOSIS — Z1231 Encounter for screening mammogram for malignant neoplasm of breast: Secondary | ICD-10-CM

## 2015-01-30 ENCOUNTER — Ambulatory Visit: Payer: Self-pay

## 2015-02-13 ENCOUNTER — Inpatient Hospital Stay: Admission: RE | Admit: 2015-02-13 | Payer: Self-pay | Source: Ambulatory Visit

## 2015-07-10 ENCOUNTER — Ambulatory Visit
Admission: RE | Admit: 2015-07-10 | Discharge: 2015-07-10 | Disposition: A | Discharge status: Home / Self Care | Payer: 59 | Source: Ambulatory Visit

## 2015-07-10 DIAGNOSIS — Z1231 Encounter for screening mammogram for malignant neoplasm of breast: Secondary | ICD-10-CM

## 2016-02-09 ENCOUNTER — Ambulatory Visit (INDEPENDENT_AMBULATORY_CARE_PROVIDER_SITE_OTHER): Payer: Self-pay | Admitting: Family Medicine

## 2016-02-09 DIAGNOSIS — Z111 Encounter for screening for respiratory tuberculosis: Secondary | ICD-10-CM

## 2016-02-12 LAB — TB SKIN TEST
INDURATION: 0 mm
TB SKIN TEST: NEGATIVE

## 2016-04-29 HISTORY — PX: COLONOSCOPY: SHX174

## 2016-06-19 ENCOUNTER — Other Ambulatory Visit: Payer: Self-pay | Admitting: Internal Medicine

## 2016-06-19 DIAGNOSIS — Z1231 Encounter for screening mammogram for malignant neoplasm of breast: Secondary | ICD-10-CM

## 2016-07-30 ENCOUNTER — Ambulatory Visit: Payer: 59

## 2016-07-30 ENCOUNTER — Ambulatory Visit
Admission: RE | Admit: 2016-07-30 | Discharge: 2016-07-30 | Disposition: A | Discharge status: Home / Self Care | Payer: BLUE CROSS/BLUE SHIELD | Source: Ambulatory Visit | Attending: Internal Medicine | Admitting: Internal Medicine

## 2016-07-30 DIAGNOSIS — Z1231 Encounter for screening mammogram for malignant neoplasm of breast: Secondary | ICD-10-CM

## 2016-09-30 ENCOUNTER — Other Ambulatory Visit (HOSPITAL_COMMUNITY)
Admission: RE | Admit: 2016-09-30 | Discharge: 2016-09-30 | Disposition: A | Discharge status: Home / Self Care | Payer: BLUE CROSS/BLUE SHIELD | Source: Ambulatory Visit | Attending: Obstetrics and Gynecology | Admitting: Obstetrics and Gynecology

## 2016-09-30 ENCOUNTER — Other Ambulatory Visit: Payer: Self-pay | Admitting: Obstetrics and Gynecology

## 2016-09-30 DIAGNOSIS — Z1151 Encounter for screening for human papillomavirus (HPV): Secondary | ICD-10-CM | POA: Insufficient documentation

## 2016-09-30 DIAGNOSIS — Z01419 Encounter for gynecological examination (general) (routine) without abnormal findings: Secondary | ICD-10-CM | POA: Diagnosis present

## 2016-10-04 LAB — CYTOLOGY - PAP
Diagnosis: NEGATIVE
HPV (WINDOPATH): NOT DETECTED

## 2017-07-14 ENCOUNTER — Other Ambulatory Visit: Payer: Self-pay | Admitting: Internal Medicine

## 2017-07-14 DIAGNOSIS — Z1231 Encounter for screening mammogram for malignant neoplasm of breast: Secondary | ICD-10-CM

## 2017-08-04 ENCOUNTER — Ambulatory Visit
Admission: RE | Admit: 2017-08-04 | Discharge: 2017-08-04 | Disposition: A | Discharge status: Home / Self Care | Payer: BLUE CROSS/BLUE SHIELD | Source: Ambulatory Visit | Attending: Internal Medicine | Admitting: Internal Medicine

## 2017-08-04 DIAGNOSIS — Z1231 Encounter for screening mammogram for malignant neoplasm of breast: Secondary | ICD-10-CM

## 2018-07-08 ENCOUNTER — Other Ambulatory Visit: Payer: Self-pay | Admitting: Internal Medicine

## 2018-07-08 DIAGNOSIS — Z1231 Encounter for screening mammogram for malignant neoplasm of breast: Secondary | ICD-10-CM

## 2018-08-06 ENCOUNTER — Inpatient Hospital Stay: Admission: RE | Admit: 2018-08-06 | Payer: BLUE CROSS/BLUE SHIELD | Source: Ambulatory Visit

## 2019-02-18 ENCOUNTER — Encounter: Payer: Self-pay | Admitting: Gastroenterology

## 2019-02-19 ENCOUNTER — Encounter: Payer: Self-pay | Admitting: Gastroenterology

## 2019-03-02 ENCOUNTER — Encounter: Payer: Self-pay | Admitting: Gastroenterology

## 2019-03-11 ENCOUNTER — Other Ambulatory Visit: Payer: Self-pay

## 2019-03-11 ENCOUNTER — Encounter: Payer: Self-pay | Admitting: Gastroenterology

## 2019-03-11 ENCOUNTER — Ambulatory Visit (INDEPENDENT_AMBULATORY_CARE_PROVIDER_SITE_OTHER): Payer: Medicare HMO | Admitting: Gastroenterology

## 2019-03-11 VITALS — BP 148/88 | HR 53 | Temp 98.2°F | Ht 61.0 in | Wt 179.0 lb

## 2019-03-11 DIAGNOSIS — Z800 Family history of malignant neoplasm of digestive organs: Secondary | ICD-10-CM | POA: Diagnosis not present

## 2019-03-11 DIAGNOSIS — R1030 Lower abdominal pain, unspecified: Secondary | ICD-10-CM | POA: Diagnosis not present

## 2019-03-11 DIAGNOSIS — K581 Irritable bowel syndrome with constipation: Secondary | ICD-10-CM

## 2019-03-11 MED ORDER — DICYCLOMINE HCL 10 MG PO CAPS: 10.0000 mg | ORAL_CAPSULE | Freq: Two times a day (BID) | ORAL | 2 refills | Status: DC

## 2019-03-11 NOTE — Patient Instructions (Signed)
If you are age 65 or older, your body mass index should be between 23-30. Your Body mass index is 33.82 kg/m. If this is out of the aforementioned range listed, please consider follow up with your Primary Care Provider.  If you are age 58 or younger, your body mass index should be between 19-25. Your Body mass index is 33.82 kg/m. If this is out of the aformentioned range listed, please consider follow up with your Primary Care Provider.   Please go to the lab at [facility] ([place] You will need to go to level "B", you do not need an appointment for this. Hours available are 7:30 am - 4:30 pm.  You will have to have your blood work done before the day of your CT Scan or your CT Scan will be canceled.   We have sent the following medications to your pharmacy for you to pick up at your convenience: Bentyl  Please purchase the following medications over the counter and take as directed: Colace once daily.   You have been scheduled [facility] [place] [address] [address] [facility]   You are scheduled on 03/18/19 at [place] should arrive 15 minutes prior to your appointment time for registration. Please follow the written instructions below on the day of your exam:  WARNING: IF YOU ARE ALLERGIC TO IODINE/X-RAY DYE, PLEASE NOTIFY RADIOLOGY IMMEDIATELY AT [phone]! YOU WILL BE GIVEN A 13 HOUR PREMEDICATION PREP.  1) Do not eat or drink anything after 5am (4 hours prior to your test) 2) You have been given 2 bottles of oral contrast to drink. The solution may taste better if refrigerated, but do NOT add ice or any other liquid to this solution. Shake well before drinking.    Drink 1 bottle of contrast @ 7am (2 hours prior to your exam)  Drink 1 bottle of contrast @ 8am (1 hour prior to your exam)  You may take any medications as prescribed with a small amount of water, if necessary.  If you take any of the following medications: METFORMIN, GLUCOPHAGE, GLUCOVANCE, AVANDAMET, RIOMET, FORTAMET, [place] MET, JANUMET, GLUMETZA or METAGLIP, you MAY be asked to HOLD this medication 48 hours AFTER the exam.  The purpose of you drinking the oral contrast is to aid in the visualization of your intestinal tract. The contrast solution may cause some diarrhea. Depending on your individual set of symptoms, you may also receive an intravenous injection of x-ray contrast/dye. Plan on being at [facility] for 30 minutes or longer, depending on the type of exam you are having performed.  This test typically takes 30-45 minutes to complete.  If you have any questions regarding your exam or if you need to reschedule, you may call the CT department at [phone] between the hours of 8:00 am and 5:00 pm, Monday-Friday.  ________________________________________________________________________  Call back in 6-8 weeks to schedule your colonoscopy.  Thank you,  Dr. Jackquline Denmark

## 2019-03-11 NOTE — Progress Notes (Signed)
Chief Complaint: Abdominal pain  Referring Provider:  Audie Pinto, FNP      ASSESSMENT AND PLAN;   #1. Lower abdo pain/chronic pelvic pain.  But now with LLQ tenderness. Neg gyn eval in past (Dr Leota Jacobsen) #2. IBS with constipation. #3. FH colon ca (brother with metastatic colon Ca at age 65, twin brother had lung Ca)  Plan: - Check CMP - CT Abdo/pel with p.o. and IV contrast. - Recommend colon in 6-8 weeks.  I have discussed risks and benefits in detail.  She wishes to proceed. - Bentyl 10mg  po bid (60, 2 refills) - colace 1 tab po qd - If still with problems, consider urology work-up. -She is awaiting appointment with rheumatology due to positive ANA    HPI:    Ashley Conway is a 63 y.o. female  With chronic lower abo pain x several years, getting worse over the last 2 to 3 months, associated with abdominal bloating.  Has longstanding history of intermittent constipation with pellet-like stools.  Has been drinking plenty of water.  Currently having BMs 4/week.  The pain does not get relieved with defecation.  She had negative GYN evaluation in the past.  Has never been to a urologist.  Does complain of frequent urination, was told she may have IC in the past.  Had responded well to Bentyl in the past.  No melena or hematochezia.  No fever.  Does have occasional chills.  Denies having any upper GI symptoms including nausea, vomiting, heartburn, regurgitation, odynophagia or dysphagia.  No recent weight loss.  No sodas, chocolates, chewing gums and candy.  No nonsteroidals.  No artificial sweeteners.  Past GI procedures: - CT AP 07/2012- neg - colon 04/2016 (PCF) polyps s/p polypectomy, mild sigmoid diverticulosis. Bx- neg, neg random TI and colon biopsies. Past Medical History:  Diagnosis Date  . Constipation   . Depression   . Essential hypertension   . Family history of colon cancer   . Hydrarthrosis   . Hypertension   . IBS (irritable bowel syndrome)   .  Obese   . Sinus bradycardia   . Spontaneous ecchymoses     Past Surgical History:  Procedure Laterality Date  . ABDOMINAL HYSTERECTOMY    . BREAST EXCISIONAL BIOPSY  1985  . CHOLECYSTECTOMY    . COLONOSCOPY  04/29/2016   Colonic polyp status post polypectomy. Minimal sigmoid diverticulosis.   . TUBAL LIGATION      Family History  Problem Relation Age of Onset  . Breast cancer Neg Hx   . Colon cancer Neg Hx     Social History   Tobacco Use  . Smoking status: Never Smoker  . Smokeless tobacco: Never Used  Substance Use Topics  . Alcohol use: No  . Drug use: No    Current Outpatient Medications  Medication Sig Dispense Refill  . lisinopril-hydrochlorothiazide (PRINZIDE,ZESTORETIC) 20-12.5 MG per tablet Take 1 tablet by mouth daily.    . traMADol (ULTRAM) 50 MG tablet Take 1 tablet (50 mg total) by mouth every 6 (six) hours as needed. 15 tablet 0  . Vitamin D, Ergocalciferol, (DRISDOL) 1.25 MG (50000 UT) CAPS capsule Take 50,000 Units by mouth every 7 (seven) days.     No current facility-administered medications for this visit.     No Known Allergies  Review of Systems:  Constitutional: Denies fever, chills, diaphoresis, appetite change and has fatigue.  HEENT: Denies photophobia, eye pain, redness, hearing loss, ear pain, congestion, sore throat, rhinorrhea, sneezing, mouth  sores, neck pain, neck stiffness and tinnitus.   Respiratory: Denies SOB, DOE, cough, chest tightness,  and wheezing.   Cardiovascular: Denies chest pain, palpitations and leg swelling.  Genitourinary: Denies dysuria, urgency, frequency, hematuria, flank pain and difficulty urinating.  Musculoskeletal: has myalgias, back pain, joint swelling, arthralgias and gait problem.  Skin: No rash.  Neurological: Denies dizziness, seizures, syncope, weakness, light-headedness, numbness and headaches.  Hematological: Denies adenopathy. Easy bruising, personal or family bleeding history   Psychiatric/Behavioral: has anxiety or depression     Physical Exam:    BP (!) 148/88   Pulse (!) 53   Temp 98.2 F (36.8 C)   Ht 5\' 1"  (1.549 m)   Wt 179 lb (81.2 kg)   SpO2 98%   BMI 33.82 kg/m  Filed Weights   03/11/19 1113  Weight: 179 lb (81.2 kg)   Constitutional:  Well-developed, in no acute distress. Psychiatric: Normal mood and affect. Behavior is normal. HEENT: Pupils normal.  Conjunctivae are normal. No scleral icterus. Neck supple.  Cardiovascular: Normal rate, regular rhythm. No edema Pulmonary/chest: Effort normal and breath sounds normal. No wheezing, rales or rhonchi. Abdominal: Soft, nondistended. LLQ tenderness. Bowel sounds active throughout. There are no masses palpable. No hepatomegaly. Rectal:  defered Neurological: Alert and oriented to person place and time. Skin: Skin is warm and dry. No rashes noted.  Data Reviewed: I have personally reviewed following labs and imaging studies  From 01/05/2019: Positive ANA 1 is to 160, uric acid 6.3, sed rate 9, negative RA screen, B12 280, TSH 1.1, iron studies ferritin 271, iron 91, transferrin 251, saturation 26%.  CBC showed hemoglobin 13.4, MCV 90, platelets 197, low vitamin D 12, urinalysis negative   Carmell Austria, MD 03/11/2019, 11:23 AM  Cc: Gweneth Fritter, FNP

## 2019-03-17 ENCOUNTER — Other Ambulatory Visit (INDEPENDENT_AMBULATORY_CARE_PROVIDER_SITE_OTHER): Payer: Medicare HMO

## 2019-03-17 DIAGNOSIS — K581 Irritable bowel syndrome with constipation: Secondary | ICD-10-CM | POA: Diagnosis not present

## 2019-03-17 DIAGNOSIS — Z800 Family history of malignant neoplasm of digestive organs: Secondary | ICD-10-CM | POA: Diagnosis not present

## 2019-03-17 DIAGNOSIS — R1030 Lower abdominal pain, unspecified: Secondary | ICD-10-CM

## 2019-03-17 LAB — COMPREHENSIVE METABOLIC PANEL
ALT: 14 U/L (ref 0–35)
AST: 19 U/L (ref 0–37)
Albumin: 4.5 g/dL (ref 3.5–5.2)
Alkaline Phosphatase: 51 U/L (ref 39–117)
BUN: 9 mg/dL (ref 6–23)
CO2: 29 mEq/L (ref 19–32)
Calcium: 9.4 mg/dL (ref 8.4–10.5)
Chloride: 103 mEq/L (ref 96–112)
Creatinine, Ser: 0.74 mg/dL (ref 0.40–1.20)
GFR: 95.26 mL/min (ref >60.00)
Glucose, Bld: 87 mg/dL (ref 70–99)
Potassium: 3.4 mEq/L — ABNORMAL LOW (ref 3.5–5.1)
Sodium: 141 mEq/L (ref 135–145)
Total Bilirubin: 0.6 mg/dL (ref 0.2–1.2)
Total Protein: 7.1 g/dL (ref 6.0–8.3)

## 2019-03-18 ENCOUNTER — Encounter (HOSPITAL_BASED_OUTPATIENT_CLINIC_OR_DEPARTMENT_OTHER): Payer: Self-pay

## 2019-03-18 ENCOUNTER — Ambulatory Visit (HOSPITAL_BASED_OUTPATIENT_CLINIC_OR_DEPARTMENT_OTHER)
Admission: RE | Admit: 2019-03-18 | Discharge: 2019-03-18 | Disposition: A | Discharge status: Home / Self Care | Payer: BLUE CROSS/BLUE SHIELD | Source: Ambulatory Visit | Attending: Gastroenterology | Admitting: Gastroenterology

## 2019-03-18 ENCOUNTER — Other Ambulatory Visit: Payer: Self-pay

## 2019-03-18 DIAGNOSIS — D509 Iron deficiency anemia, unspecified: Secondary | ICD-10-CM

## 2019-03-18 DIAGNOSIS — Z800 Family history of malignant neoplasm of digestive organs: Secondary | ICD-10-CM | POA: Insufficient documentation

## 2019-03-18 DIAGNOSIS — R1030 Lower abdominal pain, unspecified: Secondary | ICD-10-CM | POA: Insufficient documentation

## 2019-03-18 DIAGNOSIS — R197 Diarrhea, unspecified: Secondary | ICD-10-CM

## 2019-03-18 DIAGNOSIS — K581 Irritable bowel syndrome with constipation: Secondary | ICD-10-CM | POA: Diagnosis present

## 2019-03-18 DIAGNOSIS — K50919 Crohn's disease, unspecified, with unspecified complications: Secondary | ICD-10-CM

## 2019-03-18 MED ORDER — IOHEXOL 300 MG/ML  SOLN
100.0000 mL | Freq: Once | INTRAMUSCULAR | Status: AC | PRN
  Administered 2019-03-18: 100 mL via INTRAVENOUS

## 2019-03-18 MED ORDER — POTASSIUM CHLORIDE ER 20 MEQ PO TBCR: 20.0000 meq | EXTENDED_RELEASE_TABLET | Freq: Every day | ORAL | 6 refills | Status: DC

## 2019-03-23 ENCOUNTER — Other Ambulatory Visit: Payer: Self-pay

## 2019-03-23 DIAGNOSIS — K50919 Crohn's disease, unspecified, with unspecified complications: Secondary | ICD-10-CM

## 2019-03-23 DIAGNOSIS — R197 Diarrhea, unspecified: Secondary | ICD-10-CM

## 2019-03-23 DIAGNOSIS — D509 Iron deficiency anemia, unspecified: Secondary | ICD-10-CM

## 2019-03-30 ENCOUNTER — Telehealth: Payer: Self-pay | Admitting: Gastroenterology

## 2019-03-30 NOTE — Telephone Encounter (Signed)
Pt inquired about results of CT.  

## 2019-03-30 NOTE — Telephone Encounter (Signed)
Called and spoke with Ashley Conway-Ashley Conway is requesting to be scheduled for her colonoscopy-per last OV notation Ashley Conway has been scheduled for her pre visit on 04/08/2019 at 2:30 pm; Ashley Conway has been scheduled for her colonoscopy on 04/26/2019 at 10:00 am; Ashley Conway also advised that blood work has been requested of her from Dr. Lyndel Safe -Ashley Conway reports she "will get that done this week"- Ashley Conway advised to call back to the office at [phone] should questions/concerns arise;  Ashley Conway verbalized understanding of information/instructions;

## 2019-04-01 ENCOUNTER — Other Ambulatory Visit (INDEPENDENT_AMBULATORY_CARE_PROVIDER_SITE_OTHER): Payer: Medicare HMO

## 2019-04-01 ENCOUNTER — Telehealth: Payer: Self-pay | Admitting: Gastroenterology

## 2019-04-01 ENCOUNTER — Other Ambulatory Visit: Payer: Self-pay

## 2019-04-01 DIAGNOSIS — R197 Diarrhea, unspecified: Secondary | ICD-10-CM

## 2019-04-01 DIAGNOSIS — K50919 Crohn's disease, unspecified, with unspecified complications: Secondary | ICD-10-CM | POA: Diagnosis not present

## 2019-04-01 DIAGNOSIS — D509 Iron deficiency anemia, unspecified: Secondary | ICD-10-CM

## 2019-04-01 LAB — BASIC METABOLIC PANEL
BUN: 13 mg/dL (ref 6–23)
CO2: 28 mEq/L (ref 19–32)
Calcium: 9.6 mg/dL (ref 8.4–10.5)
Chloride: 102 mEq/L (ref 96–112)
Creatinine, Ser: 0.74 mg/dL (ref 0.40–1.20)
GFR: 95.25 mL/min (ref >60.00)
Glucose, Bld: 85 mg/dL (ref 70–99)
Potassium: 3.7 mEq/L (ref 3.5–5.1)
Sodium: 138 mEq/L (ref 135–145)

## 2019-04-01 LAB — VITAMIN B12: Vitamin B-12: 221 pg/mL (ref 211–911)

## 2019-04-01 LAB — FOLATE: Folate: 11.9 ng/mL (ref >5.9)

## 2019-04-01 LAB — MAGNESIUM: Magnesium: 2.1 mg/dL (ref 1.5–2.5)

## 2019-04-01 NOTE — Telephone Encounter (Signed)
Called and spoke with patient-patient informed that additional lab work has been requested by Dr. Lyndel Safe and she can complete this alb work at the [facility]; Patient verbalized understanding of information/instructions;   Patient advised to call back to the office at [phone] should questions/concerns arise;

## 2019-04-01 NOTE — Telephone Encounter (Signed)
Pt would like to know whether she needs additional blood works.  She had labs done 03/17/19 before CT.

## 2019-04-08 ENCOUNTER — Other Ambulatory Visit: Payer: Self-pay

## 2019-04-08 ENCOUNTER — Ambulatory Visit (AMBULATORY_SURGERY_CENTER): Payer: Medicare HMO | Admitting: *Deleted

## 2019-04-08 VITALS — Temp 97.9°F | Ht 62.0 in | Wt 179.6 lb

## 2019-04-08 DIAGNOSIS — Z1159 Encounter for screening for other viral diseases: Secondary | ICD-10-CM

## 2019-04-08 DIAGNOSIS — Z800 Family history of malignant neoplasm of digestive organs: Secondary | ICD-10-CM

## 2019-04-08 MED ORDER — NA SULFATE-K SULFATE-MG SULF 17.5-3.13-1.6 GM/177ML PO SOLN: 1.0000 | Freq: Once | ORAL | 0 refills | Status: AC

## 2019-04-08 NOTE — Progress Notes (Signed)

## 2019-04-12 ENCOUNTER — Encounter: Payer: Self-pay | Admitting: Gastroenterology

## 2019-04-21 ENCOUNTER — Ambulatory Visit (INDEPENDENT_AMBULATORY_CARE_PROVIDER_SITE_OTHER): Payer: Medicare HMO

## 2019-04-21 ENCOUNTER — Other Ambulatory Visit: Payer: Self-pay | Admitting: Gastroenterology

## 2019-04-21 DIAGNOSIS — Z1159 Encounter for screening for other viral diseases: Secondary | ICD-10-CM

## 2019-04-22 LAB — SARS CORONAVIRUS 2 (TAT 6-24 HRS): SARS Coronavirus 2: NEGATIVE

## 2019-04-26 ENCOUNTER — Encounter: Payer: Self-pay | Admitting: Gastroenterology

## 2019-04-26 ENCOUNTER — Other Ambulatory Visit: Payer: Self-pay

## 2019-04-26 ENCOUNTER — Ambulatory Visit (AMBULATORY_SURGERY_CENTER): Payer: Medicare HMO | Admitting: Gastroenterology

## 2019-04-26 VITALS — BP 109/74 | HR 45 | Temp 97.5°F | Resp 16 | Ht 61.0 in | Wt 179.6 lb

## 2019-04-26 DIAGNOSIS — R1030 Lower abdominal pain, unspecified: Secondary | ICD-10-CM | POA: Diagnosis not present

## 2019-04-26 DIAGNOSIS — Z800 Family history of malignant neoplasm of digestive organs: Secondary | ICD-10-CM

## 2019-04-26 DIAGNOSIS — K581 Irritable bowel syndrome with constipation: Secondary | ICD-10-CM

## 2019-04-26 DIAGNOSIS — K648 Other hemorrhoids: Secondary | ICD-10-CM

## 2019-04-26 MED ORDER — SODIUM CHLORIDE 0.9 % IV SOLN: 500.0000 mL | Freq: Once | INTRAVENOUS | Status: DC

## 2019-04-26 NOTE — Progress Notes (Signed)
Pt's states no medical or surgical changes since previsit or office visit. 

## 2019-04-26 NOTE — Op Note (Signed)
[facility] Patient Name: Ashley ScarceLoretta Conway Procedure Date: 04/26/2019 9:52 AM MRN: [id number] Endoscopist: Lynann Bolognaajesh Tayla Panozzo , MD Age: 63 Referring MD:  Date of Birth: 01/13/1956 Gender: Female Account #: [id number] Procedure:                Colonoscopy Indications:              #1. Lower abdo pain/chronic pelvic pain. Neg gyn                            eval in past (Dr Leota JacobsenGaccione). Neg CT AP 02/2019.                           #2. IBS with constipation.                           #3. FH colon ca (brother with metastatic colon Ca                            at age 65, twin brother had lung Ca) Medicines:                Monitored Anesthesia Care Procedure:                Pre-Anesthesia Assessment:                           - Prior to the procedure, a History and Physical                            was performed, and patient medications and                            allergies were reviewed. The patient's tolerance of                            previous anesthesia was also reviewed. The risks                            and benefits of the procedure and the sedation                            options and risks were discussed with the patient.                            All questions were answered, and informed consent                            was obtained. Prior Anticoagulants: The patient has                            taken no previous anticoagulant or antiplatelet                            agents. ASA Grade Assessment: II - A patient with  mild systemic disease. After reviewing the risks                            and benefits, the patient was deemed in                            satisfactory condition to undergo the procedure.                           After obtaining informed consent, the colonoscope                            was passed under direct vision. Throughout the                            procedure, the patient's blood pressure, pulse, and                         oxygen saturations were monitored continuously. The                            Colonoscope was introduced through the anus and                            advanced to the 2 cm into the ileum. The                            colonoscopy was performed without difficulty. The                            patient tolerated the procedure well. The quality                            of the bowel preparation was good. The terminal                            ileum, ileocecal valve, appendiceal orifice, and                            rectum were photographed. Scope In: 9:55:24 AM Scope Out: 10:07:04 AM Scope Withdrawal Time: 0 hours 8 minutes 5 seconds  Total Procedure Duration: 0 hours 11 minutes 40 seconds  Findings:                 A few small-mouthed diverticula were found in the                            sigmoid colon.                           Non-bleeding internal hemorrhoids were found during                            retroflexion. The hemorrhoids were small.  The terminal ileum appeared normal.                           The exam was otherwise without abnormality on                            direct and retroflexion views. Small incidental 1                            cm yellowish lipoma was noted opposite ileocecal                            valve. Complications:            No immediate complications. Estimated Blood Loss:     Estimated blood loss: none. Impression:               -Minimal sigmoid diverticulosis.                           -Non-bleeding internal hemorrhoids.                           -Otherwise normal colonoscopy to TI. Recommendation:           - Patient has a contact number available for                            emergencies. The signs and symptoms of potential                            delayed complications were discussed with the                            patient. Return to normal activities tomorrow.                             Written discharge instructions were provided to the                            patient.                           - Resume previous high-fiber diet. Increase water                            intake.                           - Continue present medications.                           - Repeat colonoscopy in 5 years for screening                            purposes. Earlier if with any new problems or if  there is any change in family history.                           - Return to GI clinic in 12 weeks. Lynann Bologna, MD 04/26/2019 10:14:10 AM This report has been signed electronically.

## 2019-04-26 NOTE — Progress Notes (Signed)
Temperature taken by L.C., VS taken by C.B. 

## 2019-04-26 NOTE — Progress Notes (Signed)
No problems noted in the recovery room. maw 

## 2019-04-26 NOTE — Patient Instructions (Addendum)
YOU HAD AN ENDOSCOPIC PROCEDURE TODAY AT THE [facility]:   Refer to the procedure report that was given to you for any specific questions about what was found during the examination.  If the procedure report does not answer your questions, please call your gastroenterologist to clarify.  If you requested that your care partner not be given the details of your procedure findings, then the procedure report has been included in a sealed envelope for you to review at your convenience later.  YOU SHOULD EXPECT: Some feelings of bloating in the abdomen. Passage of more gas than usual.  Walking can help get rid of the air that was put into your GI tract during the procedure and reduce the bloating. If you had a lower endoscopy (such as a colonoscopy or flexible sigmoidoscopy) you may notice spotting of blood in your stool or on the toilet paper. If you underwent a bowel prep for your procedure, you may not have a normal bowel movement for a few days.  Please Note:  You might notice some irritation and congestion in your nose or some drainage.  This is from the oxygen used during your procedure.  There is no need for concern and it should clear up in a day or so.  SYMPTOMS TO REPORT IMMEDIATELY:   Following lower endoscopy (colonoscopy or flexible sigmoidoscopy):  Excessive amounts of blood in the stool  Significant tenderness or worsening of abdominal pains  Swelling of the abdomen that is new, acute  Fever of 100F or higher   For urgent or emergent issues, a gastroenterologist can be reached at any hour by calling [phone].   DIET:  We do recommend a small meal at first, but then you may proceed to your regular diet.  Drink plenty of fluids but you should avoid alcoholic beverages for 24 hours.  ACTIVITY:  You should plan to take it easy for the rest of today and you should NOT DRIVE or use heavy machinery until tomorrow (because of the sedation medicines used during the test).     FOLLOW UP: Our staff will call the number listed on your records 48-72 hours following your procedure to check on you and address any questions or concerns that you may have regarding the information given to you following your procedure. If we do not reach you, we will leave a message.  We will attempt to reach you two times.  During this call, we will ask if you have developed any symptoms of COVID 19. If you develop any symptoms (ie: fever, flu-like symptoms, shortness of breath, cough etc.) before then, please call [phone].  If you test positive for Covid 19 in the 2 weeks post procedure, please call and report this information to [place].    If any biopsies were taken you will be contacted by phone or by letter within the next 1-3 weeks.  Please call us at [phone] if you have not heard about the biopsies in 3 weeks.    SIGNATURES/CONFIDENTIALITY: You and/or your care partner have signed paperwork which will be entered into your electronic medical record.  These signatures attest to the fact that that the information above on your After Visit Summary has been reviewed and is understood.  Full responsibility of the confidentiality of this discharge information lies with you and/or your care-partner.     Handouts were given to you on hemorrhoids, diverticulosis, and a high fiber diet with liberal fluid intake.  Increase fluid intake. You may  resume your current medications today. Repeat colonoscopy in 5 years for screening purposes.  Earlier if any new problems or if there is any changes. The office will call you to set up an appointment in 12 weeks to see Dr. Lyndel Safe. Please call if any questions or concerns.

## 2019-04-26 NOTE — Progress Notes (Signed)
To PACU, VSS. Report to [email or web address] 

## 2019-04-28 ENCOUNTER — Telehealth: Payer: Self-pay

## 2019-04-28 NOTE — Telephone Encounter (Signed)
  Follow up Call-  Call back number 04/26/2019  Post procedure Call Back phone  # [phone]  Permission to leave phone message Yes  Some recent data might be hidden     Patient questions:  Do you have a fever, pain , or abdominal swelling? No. Pain Score  0 *  Have you tolerated food without any problems? Yes.    Have you been able to return to your normal activities? Yes.    Do you have any questions about your discharge instructions: Diet   No. Medications  No. Follow up visit  No.  Do you have questions or concerns about your Care? No.  Actions: * If pain score is 4 or above: 1. No action needed, pain <4.Have you developed a fever since your procedure? no  2.   Have you had an respiratory symptoms (SOB or cough) since your procedure? no  3.   Have you tested positive for COVID 19 since your procedure no  4.   Have you had any family members/close contacts diagnosed with the COVID 19 since your procedure?  no   If yes to any of these questions please route to Joylene John, RN and Alphonsa Gin, Therapist, sports.

## 2019-06-15 ENCOUNTER — Other Ambulatory Visit: Payer: Self-pay | Admitting: Gastroenterology

## 2019-08-20 ENCOUNTER — Other Ambulatory Visit: Payer: Self-pay | Admitting: Gastroenterology

## 2019-11-04 ENCOUNTER — Other Ambulatory Visit: Payer: Self-pay | Admitting: Internal Medicine

## 2019-11-04 DIAGNOSIS — Z1231 Encounter for screening mammogram for malignant neoplasm of breast: Secondary | ICD-10-CM

## 2019-11-18 ENCOUNTER — Other Ambulatory Visit: Payer: Self-pay

## 2019-11-18 ENCOUNTER — Ambulatory Visit
Admission: RE | Admit: 2019-11-18 | Discharge: 2019-11-18 | Disposition: A | Discharge status: Home / Self Care | Payer: Medicare HMO | Source: Ambulatory Visit | Attending: Internal Medicine | Admitting: Internal Medicine

## 2019-11-18 DIAGNOSIS — Z1231 Encounter for screening mammogram for malignant neoplasm of breast: Secondary | ICD-10-CM

## 2019-12-13 ENCOUNTER — Ambulatory Visit: Payer: Medicare HMO | Admitting: Physician Assistant

## 2020-04-03 ENCOUNTER — Ambulatory Visit: Payer: Medicare HMO | Admitting: Physician Assistant

## 2020-04-16 ENCOUNTER — Other Ambulatory Visit: Payer: Self-pay | Admitting: Gastroenterology

## 2020-04-16 DIAGNOSIS — D509 Iron deficiency anemia, unspecified: Secondary | ICD-10-CM

## 2020-04-16 DIAGNOSIS — K50919 Crohn's disease, unspecified, with unspecified complications: Secondary | ICD-10-CM

## 2020-04-16 DIAGNOSIS — R197 Diarrhea, unspecified: Secondary | ICD-10-CM

## 2020-05-28 ENCOUNTER — Other Ambulatory Visit: Payer: Self-pay | Admitting: Gastroenterology

## 2020-09-06 ENCOUNTER — Other Ambulatory Visit: Payer: Self-pay | Admitting: Internal Medicine

## 2020-09-06 DIAGNOSIS — Z1231 Encounter for screening mammogram for malignant neoplasm of breast: Secondary | ICD-10-CM

## 2020-09-14 ENCOUNTER — Encounter: Payer: Self-pay | Admitting: Physician Assistant

## 2020-09-14 ENCOUNTER — Other Ambulatory Visit: Payer: Self-pay

## 2020-09-14 ENCOUNTER — Ambulatory Visit: Payer: Medicare HMO | Admitting: Physician Assistant

## 2020-09-14 DIAGNOSIS — L820 Inflamed seborrheic keratosis: Secondary | ICD-10-CM | POA: Diagnosis not present

## 2020-09-14 NOTE — Progress Notes (Signed)
   New Patient   Subjective  Ashley Conway is a 65 y.o. female who presents for the following: Skin Problem (Left cheek possible isk and corners of the mouth dark spots).   The following portions of the chart were reviewed this encounter and updated as appropriate:  Tobacco  Allergies  Meds  Problems  Med Hx  Surg Hx  Fam Hx      Objective  Well appearing patient in no apparent distress; mood and affect are within normal limits.  All skin waist up examined.  Objective  left Buccal Cheek: Erythematous stuck-on, waxy plaque.    Assessment & Plan  Inflamed seborrheic keratosis left Buccal Cheek  Destruction of lesion - left Buccal Cheek Complexity: simple   Destruction method: cryotherapy   Informed consent: discussed and consent obtained   Timeout:  patient name, date of birth, surgical site, and procedure verified Lesion destroyed using liquid nitrogen: Yes   Cryotherapy cycles:  1 Outcome: patient tolerated procedure well with no complications   Post-procedure details: wound care instructions given       I, Kashae Carstens, PA-C, have reviewed all documentation for this visit. The documentation on 09/14/20 for the exam, diagnosis, procedures, and orders are all accurate and complete.

## 2020-09-14 NOTE — Patient Instructions (Signed)
Seborrheic keratosis

## 2020-11-21 ENCOUNTER — Other Ambulatory Visit: Payer: Self-pay

## 2020-11-21 ENCOUNTER — Ambulatory Visit
Admission: RE | Admit: 2020-11-21 | Discharge: 2020-11-21 | Disposition: A | Discharge status: Home / Self Care | Payer: Medicare HMO | Source: Ambulatory Visit | Attending: Internal Medicine | Admitting: Internal Medicine

## 2020-11-21 DIAGNOSIS — Z1231 Encounter for screening mammogram for malignant neoplasm of breast: Secondary | ICD-10-CM

## 2021-02-24 ENCOUNTER — Other Ambulatory Visit: Payer: Self-pay | Admitting: Gastroenterology

## 2021-02-24 DIAGNOSIS — D509 Iron deficiency anemia, unspecified: Secondary | ICD-10-CM

## 2021-02-24 DIAGNOSIS — K50919 Crohn's disease, unspecified, with unspecified complications: Secondary | ICD-10-CM

## 2021-02-24 DIAGNOSIS — R197 Diarrhea, unspecified: Secondary | ICD-10-CM

## 2021-03-26 ENCOUNTER — Telehealth: Payer: Self-pay | Admitting: Gastroenterology

## 2021-03-26 NOTE — Telephone Encounter (Signed)
Inbound call from patient with questions about if she should continue dicyclomine. States she is not experiencing the spasms nor abd pain and haven't taken the medication in a while. Would like a call back to discuss

## 2021-03-27 NOTE — Telephone Encounter (Signed)
LVM for patient to call back. I tried to call patient but can you advice on this? Patient hasn't seen [place] in 2 years

## 2021-03-29 NOTE — Telephone Encounter (Signed)
LVM for patient to call back. She doesn't have to take it if she doesn't need it but for more refills she will need an office visit.

## 2021-03-29 NOTE — Telephone Encounter (Signed)
She has not taken dicyclomine. If she is not having any spasms, she does not need it. RG

## 2021-03-30 NOTE — Telephone Encounter (Signed)
LVM

## 2021-04-18 ENCOUNTER — Telehealth: Payer: Self-pay

## 2021-04-18 NOTE — Telephone Encounter (Signed)
I have called and left voicemail for patient to call back and schedule recall colonoscopy with pre-visit.

## 2021-04-24 NOTE — Telephone Encounter (Signed)
2 recalls placed in the patients chart one for 2023 and the other 2025. The 2025 is correct per chart.

## 2021-06-11 ENCOUNTER — Other Ambulatory Visit: Payer: Self-pay | Admitting: Gastroenterology

## 2021-06-11 DIAGNOSIS — K50919 Crohn's disease, unspecified, with unspecified complications: Secondary | ICD-10-CM

## 2021-06-11 DIAGNOSIS — R197 Diarrhea, unspecified: Secondary | ICD-10-CM

## 2021-06-11 DIAGNOSIS — D509 Iron deficiency anemia, unspecified: Secondary | ICD-10-CM

## 2021-07-01 ENCOUNTER — Other Ambulatory Visit: Payer: Self-pay | Admitting: Gastroenterology

## 2021-07-01 DIAGNOSIS — K50919 Crohn's disease, unspecified, with unspecified complications: Secondary | ICD-10-CM

## 2021-07-01 DIAGNOSIS — D509 Iron deficiency anemia, unspecified: Secondary | ICD-10-CM

## 2021-07-01 DIAGNOSIS — R197 Diarrhea, unspecified: Secondary | ICD-10-CM

## 2021-10-16 ENCOUNTER — Other Ambulatory Visit: Payer: Self-pay | Admitting: Internal Medicine

## 2021-10-16 DIAGNOSIS — Z1231 Encounter for screening mammogram for malignant neoplasm of breast: Secondary | ICD-10-CM

## 2021-11-23 ENCOUNTER — Ambulatory Visit
Admission: RE | Admit: 2021-11-23 | Discharge: 2021-11-23 | Disposition: A | Discharge status: Home / Self Care | Payer: Medicare HMO | Source: Ambulatory Visit | Attending: Internal Medicine | Admitting: Internal Medicine

## 2021-11-23 DIAGNOSIS — Z1231 Encounter for screening mammogram for malignant neoplasm of breast: Secondary | ICD-10-CM

## 2022-02-11 IMAGING — MG MM DIGITAL SCREENING BILAT W/ TOMO AND CAD
8 series · 8 of 24 positions shown · non-contrast
Comparison: Previous exam(s).

CLINICAL DATA: Screening.

EXAM:
DIGITAL SCREENING BILATERAL MAMMOGRAM WITH TOMOSYNTHESIS AND CAD
TECHNIQUE: Bilateral screening digital craniocaudal and mediolateral oblique
mammograms were obtained. Bilateral screening digital breast
tomosynthesis was performed. The images were evaluated with
computer-aided detection.

[L MLO synth-2D]
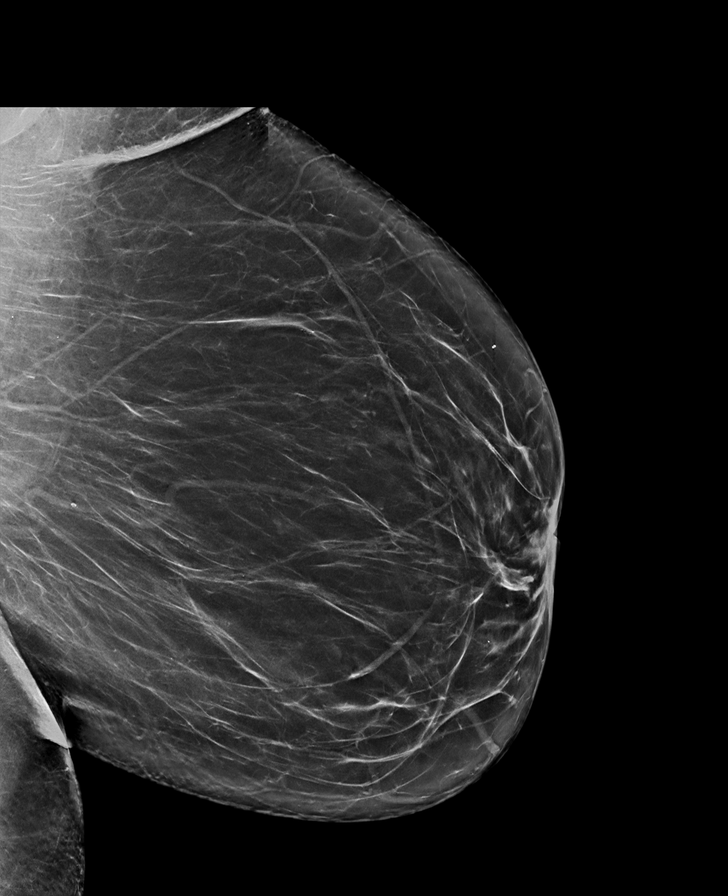

[L CC synth-2D]
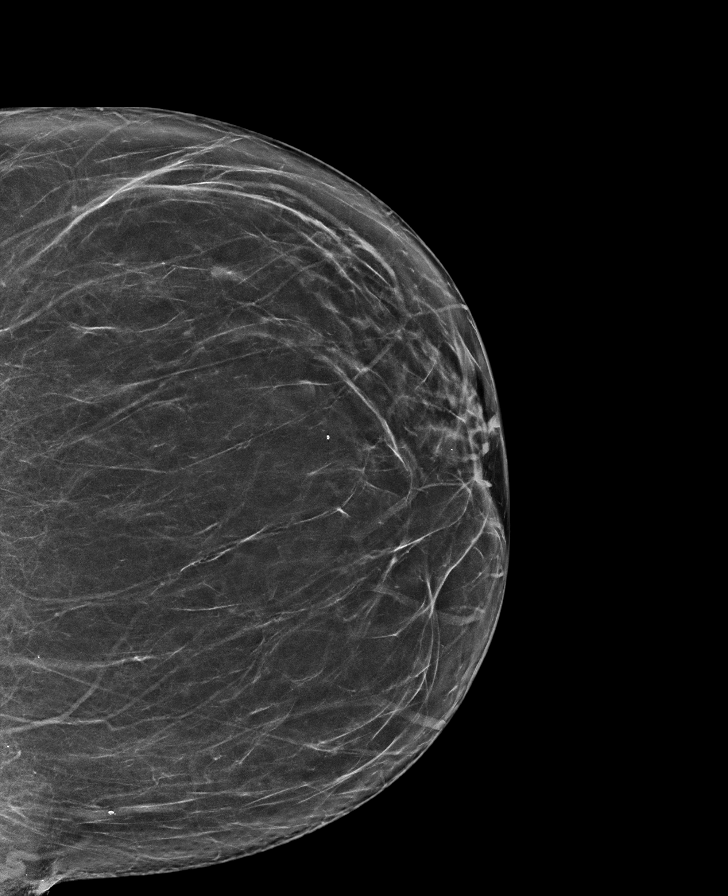

[R MLO synth-2D]
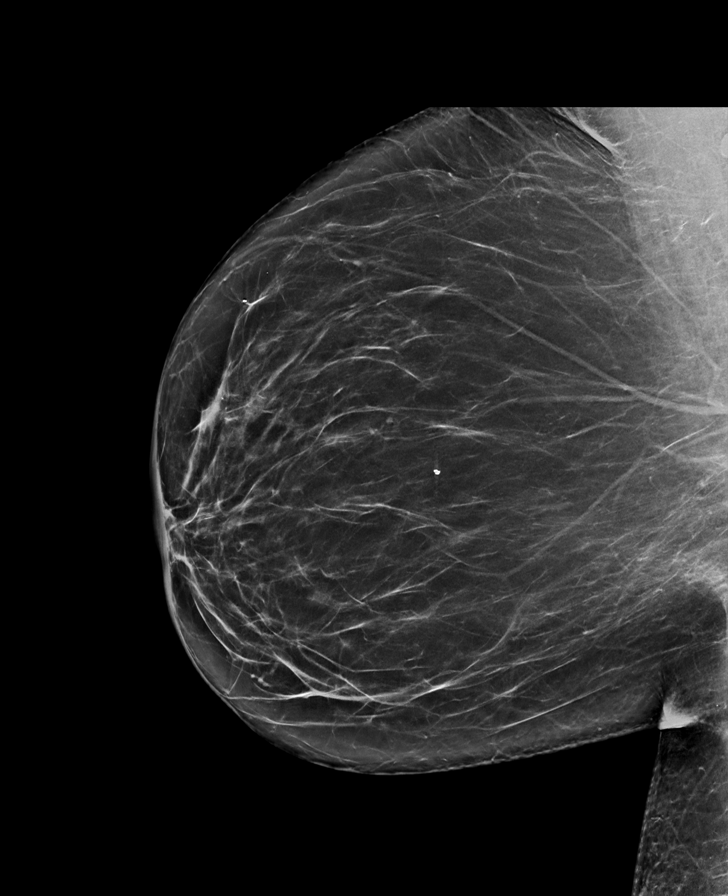

[R CC synth-2D]
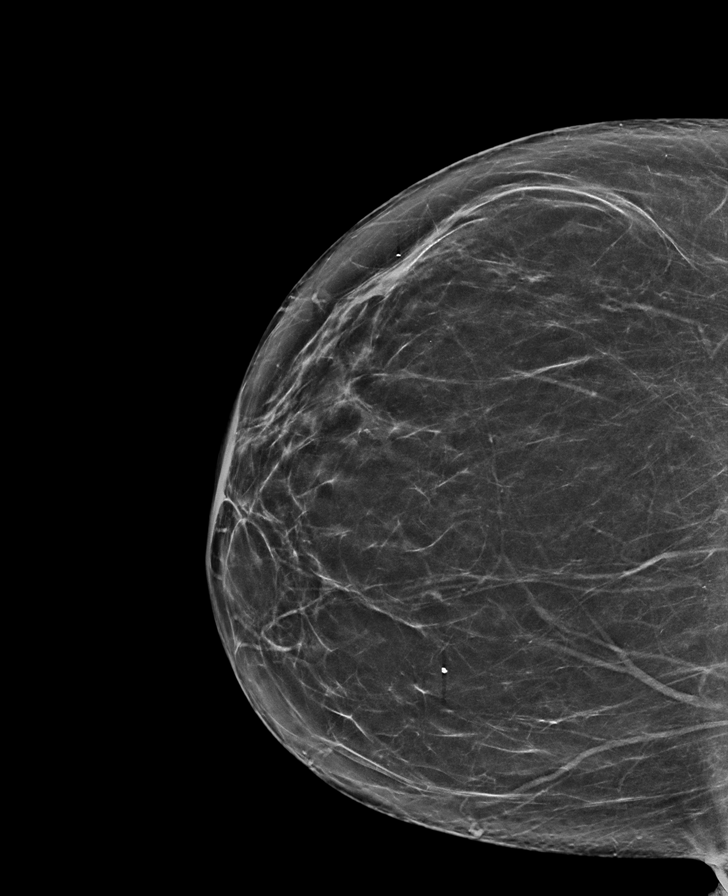

[R CC tomo · tomo slice 37/72.0]
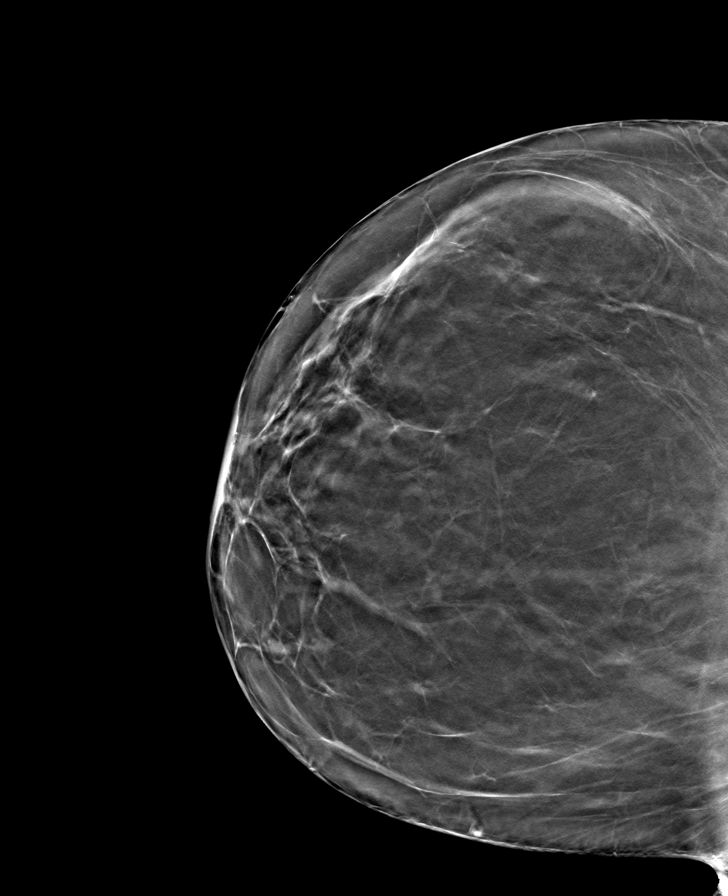

[R MLO tomo · tomo slice 43/84.0]
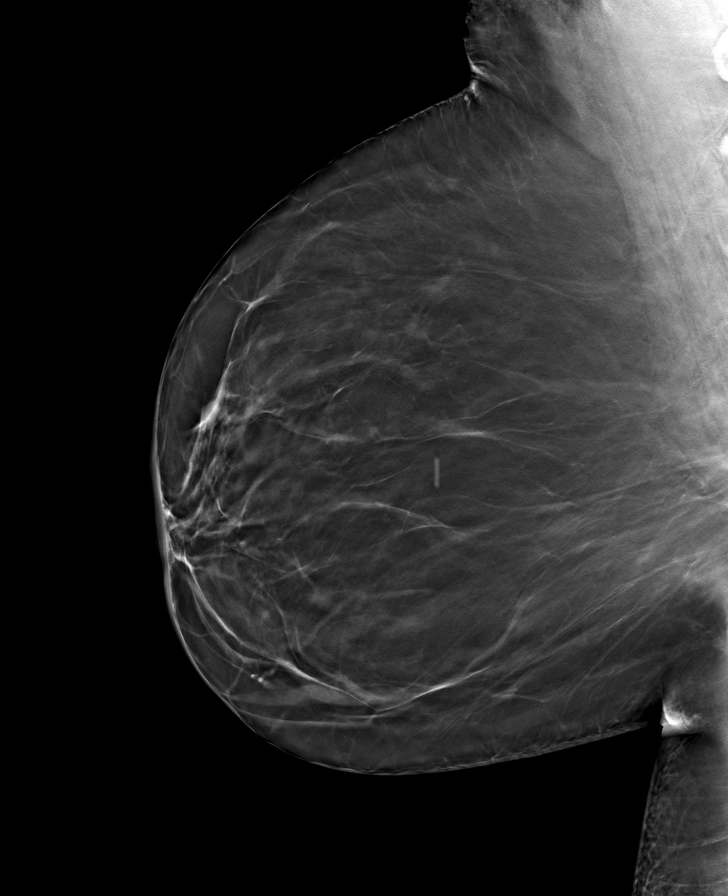

[L CC tomo · tomo slice 37/72.0]
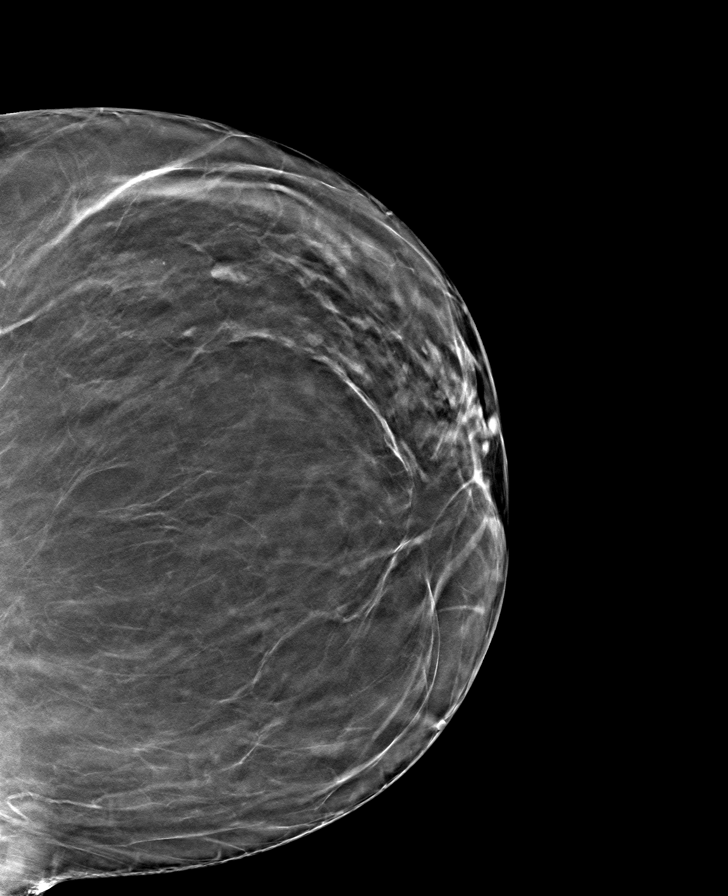

[L MLO tomo · tomo slice 43/86.0]
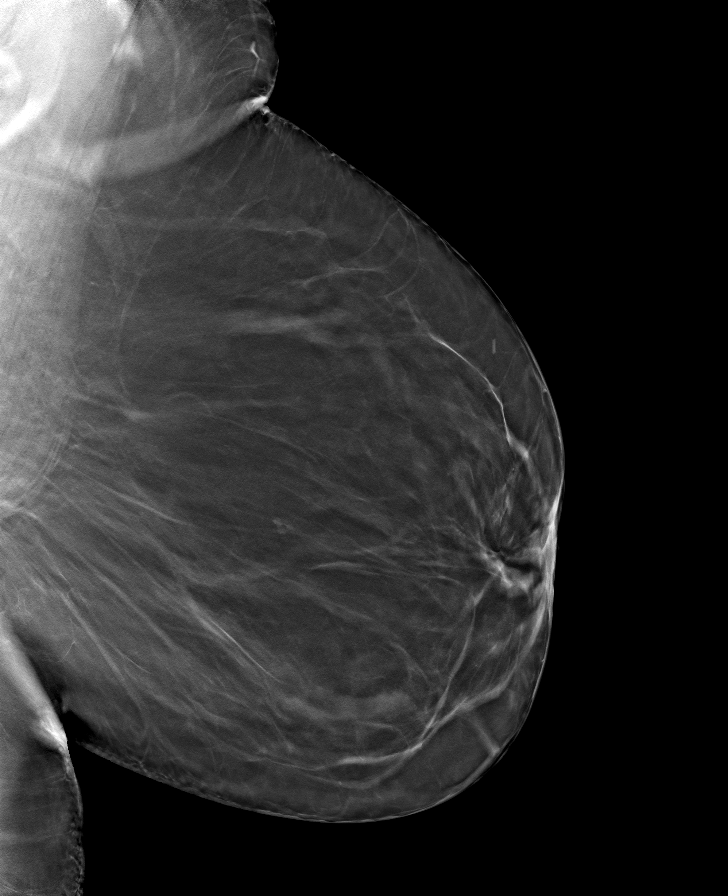

[8 of 24 positions shown; findings below may reference images not displayed]

ACR Breast Density Category b: There are scattered areas of
fibroglandular density.
FINDINGS: There are no findings suspicious for malignancy.
IMPRESSION: No mammographic evidence of malignancy. A result letter of this
screening mammogram will be mailed directly to the patient.

RECOMMENDATION:
Screening mammogram in one year. (Code:51-O-LD2)

BI-RADS CATEGORY  1: Negative.

## 2022-03-12 ENCOUNTER — Ambulatory Visit: Payer: Medicare HMO | Admitting: Physician Assistant

## 2022-06-14 ENCOUNTER — Other Ambulatory Visit: Payer: Self-pay | Admitting: Internal Medicine

## 2022-06-14 DIAGNOSIS — Z1231 Encounter for screening mammogram for malignant neoplasm of breast: Secondary | ICD-10-CM

## 2022-10-17 ENCOUNTER — Other Ambulatory Visit: Payer: Self-pay | Admitting: Family Medicine

## 2022-10-17 DIAGNOSIS — Z1231 Encounter for screening mammogram for malignant neoplasm of breast: Secondary | ICD-10-CM

## 2022-11-26 ENCOUNTER — Ambulatory Visit
Admission: RE | Admit: 2022-11-26 | Discharge: 2022-11-26 | Disposition: A | Discharge status: Home / Self Care | Payer: Medicare HMO | Source: Ambulatory Visit | Attending: Family Medicine | Admitting: Family Medicine

## 2022-11-26 DIAGNOSIS — Z1231 Encounter for screening mammogram for malignant neoplasm of breast: Secondary | ICD-10-CM

## 2022-12-16 ENCOUNTER — Other Ambulatory Visit: Payer: Self-pay | Admitting: Family Medicine

## 2022-12-16 DIAGNOSIS — M8588 Other specified disorders of bone density and structure, other site: Secondary | ICD-10-CM

## 2022-12-16 DIAGNOSIS — Z780 Asymptomatic menopausal state: Secondary | ICD-10-CM

## 2023-06-16 ENCOUNTER — Ambulatory Visit
Admission: RE | Admit: 2023-06-16 | Discharge: 2023-06-16 | Disposition: A | Discharge status: Home / Self Care | Payer: Medicare HMO | Source: Ambulatory Visit | Attending: Family Medicine | Admitting: Family Medicine

## 2023-06-16 DIAGNOSIS — Z780 Asymptomatic menopausal state: Secondary | ICD-10-CM

## 2023-06-16 DIAGNOSIS — M8588 Other specified disorders of bone density and structure, other site: Secondary | ICD-10-CM

## 2023-07-23 ENCOUNTER — Other Ambulatory Visit: Payer: Self-pay | Admitting: Family Medicine

## 2023-07-23 DIAGNOSIS — Z1231 Encounter for screening mammogram for malignant neoplasm of breast: Secondary | ICD-10-CM

## 2023-11-27 ENCOUNTER — Ambulatory Visit
Admission: RE | Admit: 2023-11-27 | Discharge: 2023-11-27 | Disposition: A | Discharge status: Home / Self Care | Source: Ambulatory Visit | Attending: Family Medicine | Admitting: Family Medicine

## 2023-11-27 DIAGNOSIS — Z1231 Encounter for screening mammogram for malignant neoplasm of breast: Secondary | ICD-10-CM

## 2024-05-31 ENCOUNTER — Ambulatory Visit (AMBULATORY_SURGERY_CENTER)

## 2024-05-31 VITALS — Ht 61.0 in | Wt 170.0 lb

## 2024-05-31 DIAGNOSIS — Z800 Family history of malignant neoplasm of digestive organs: Secondary | ICD-10-CM

## 2024-05-31 DIAGNOSIS — K581 Irritable bowel syndrome with constipation: Secondary | ICD-10-CM

## 2024-05-31 MED ORDER — NA SULFATE-K SULFATE-MG SULF 17.5-3.13-1.6 GM/177ML PO SOLN: 1.0000 | Freq: Once | ORAL | 0 refills | Status: AC

## 2024-05-31 NOTE — Progress Notes (Signed)
 RN confirmed patient name, date of birth, and address RN confirmed date and time of procedure RN reviewed and confirmed allergies  RN reviewed and updated current medications; confirmed preferred pharmacy Pt is not on diet pills nor GLP-1 medications Pt is not on blood thinners RN reviewed medical & surgical hx  Pt denies issues with chronic constipation  Diabetic - no No A fib or A flutter No cardiac tests are pending  Pt is not on home 02  No issues known with past sedation with any surgeries or procedures Patient denies ever being told they had issues or difficulty with intubation  Patient unaware of any fh of malignant hyperthermia Ambulates independently RN reviewed prep instructions and explained time frames for holding certain medications RN answered patient questions; patient stated understanding Prep instructions sent

## 2024-06-07 ENCOUNTER — Encounter: Payer: Self-pay | Admitting: Gastroenterology

## 2024-06-07 ENCOUNTER — Telehealth: Payer: Self-pay | Admitting: Gastroenterology

## 2024-06-07 NOTE — Telephone Encounter (Signed)
 RN returned call to patient who had clarifying questions about her colonoscopy prep instructions. RN confirmed that patient had received her letter with prep instructions; patient stated that she does have the letter and has been referencing it. RN addressed patient's questions; patient stated understanding.

## 2024-06-07 NOTE — Telephone Encounter (Signed)
 Inbound call from patient requesting a call to discuss prep instruction questions she has. Please advise, thank you

## 2024-06-17 ENCOUNTER — Encounter: Payer: Self-pay | Admitting: Gastroenterology

## 2024-06-17 ENCOUNTER — Ambulatory Visit: Admitting: Gastroenterology

## 2024-06-17 VITALS — BP 121/69 | HR 45 | Temp 97.9°F | Resp 17 | Ht 61.0 in | Wt 170.0 lb

## 2024-06-17 DIAGNOSIS — K581 Irritable bowel syndrome with constipation: Secondary | ICD-10-CM

## 2024-06-17 DIAGNOSIS — Z800 Family history of malignant neoplasm of digestive organs: Secondary | ICD-10-CM

## 2024-06-17 DIAGNOSIS — K5730 Diverticulosis of large intestine without perforation or abscess without bleeding: Secondary | ICD-10-CM | POA: Diagnosis not present

## 2024-06-17 DIAGNOSIS — Z1211 Encounter for screening for malignant neoplasm of colon: Secondary | ICD-10-CM | POA: Diagnosis not present

## 2024-06-17 DIAGNOSIS — K6389 Other specified diseases of intestine: Secondary | ICD-10-CM

## 2024-06-17 DIAGNOSIS — K640 First degree hemorrhoids: Secondary | ICD-10-CM | POA: Diagnosis not present

## 2024-06-17 DIAGNOSIS — D120 Benign neoplasm of cecum: Secondary | ICD-10-CM

## 2024-06-17 DIAGNOSIS — D175 Benign lipomatous neoplasm of intra-abdominal organs: Secondary | ICD-10-CM | POA: Diagnosis not present

## 2024-06-17 MED ORDER — SODIUM CHLORIDE 0.9 % IV SOLN: 500.0000 mL | Freq: Once | INTRAVENOUS | Status: DC

## 2024-06-17 NOTE — Progress Notes (Signed)
Pt. states no medical or surgical changes since previsit or office visit. 

## 2024-06-17 NOTE — Op Note (Signed)
 [facility] Patient Name: Ashley Conway Procedure Date: 06/17/2024 9:01 AM MRN: [id number] Endoscopist: Lynnie Bring , MD, [id number] Age: 69 Referring MD:  Date of Birth: [birth year] Gender: Female Account #: [id number] Procedure:                Colonoscopy Indications:              Screening in patient at increased risk: Colorectal                            cancer in brother before age 41 Medicines:                Monitored Anesthesia Care Procedure:                Pre-Anesthesia Assessment:                           - Prior to the procedure, a History and Physical                            was performed, and patient medications and                            allergies were reviewed. The patient's tolerance of                            previous anesthesia was also reviewed. The risks                            and benefits of the procedure and the sedation                            options and risks were discussed with the patient.                            All questions were answered, and informed consent                            was obtained. Prior Anticoagulants: The patient has                            taken no anticoagulant or antiplatelet agents. ASA                            Grade Assessment: II - A patient with mild systemic                            disease. After reviewing the risks and benefits,                            the patient was deemed in satisfactory condition to                            undergo the procedure.  After obtaining informed consent, the colonoscope                            was passed under direct vision. Throughout the                            procedure, the patient's blood pressure, pulse, and                            oxygen saturations were monitored continuously. The                            PCF-HQ190L Colonoscope [id number] was introduced                            through the anus and advanced  to the 2 cm into the                            ileum. The colonoscopy was performed without                            difficulty. The patient tolerated the procedure                            well. The quality of the bowel preparation was                            good. The terminal ileum, ileocecal valve,                            appendiceal orifice, and rectum were photographed. Scope In: 9:07:12 AM Scope Out: 9:20:34 AM Scope Withdrawal Time: 0 hours 9 minutes 17 seconds  Total Procedure Duration: 0 hours 13 minutes 22 seconds  Findings:                 The colon (entire examined portion) appeared normal.                           The ileocecal valve was prominent and moderately                            lipomatous. Biopsies were taken with a cold forceps                            for histology. Another small lipoma was noted                            opposite ileocecal valve measuring approximately 1                            cm.                           A few small-mouthed diverticula were found in the  sigmoid colon.                           Non-bleeding internal hemorrhoids were found during                            retroflexion. The hemorrhoids were small and Grade                            I (internal hemorrhoids that do not prolapse).                           The terminal ileum appeared normal.                           The exam was otherwise without abnormality on                            direct and retroflexion views. Complications:            No immediate complications. Estimated Blood Loss:     Estimated blood loss: none. Impression:               - Prominant ileocecal valve. Biopsied to r/o                            Tubular adenoma.                           - Mild sigmoid diverticulosis.                           - Non-bleeding internal hemorrhoids.                           - The examined portion of the ileum was normal.                            - The examination was otherwise normal on direct                            and retroflexion views. Recommendation:           - Patient has a contact number available for                            emergencies. The signs and symptoms of potential                            delayed complications were discussed with the                            patient. Return to normal activities tomorrow.                            Written discharge instructions were provided to the  patient.                           - Resume previous diet.                           - Continue present medications.                           - Await pathology results.                           - Repeat colonoscopy in 5 years for screening                            purposes.                           - The findings and recommendations were discussed                            with the patient's family. Lynnie Bring, MD 06/17/2024 9:26:38 AM This report has been signed electronically.

## 2024-06-17 NOTE — Progress Notes (Signed)
 "   Chief Complaint: Abdominal pain  Referring Provider:  Jackolyn Darice BROCKS, FNP      ASSESSMENT AND PLAN;   IBS with constipation. FH colon ca (brother with metastatic colon Ca at age 71, twin brother had lung Ca)  Plan:   For colon today   HPI:    Ashley Conway is a 69 y.o. female  With chronic lower abo pain x several years, getting worse over the last 2 to 3 months, associated with abdominal bloating.  Has longstanding history of intermittent constipation with pellet-like stools.  Has been drinking plenty of water.  Currently having BMs 4/week.  The pain does not get relieved with defecation.  She had negative GYN evaluation in the past.  Has never been to a urologist.  Does complain of frequent urination, was told she may have IC in the past.  Had responded well to Bentyl  in the past.  No melena or hematochezia.  No fever.  Does have occasional chills.  Denies having any upper GI symptoms including nausea, vomiting, heartburn, regurgitation, odynophagia or dysphagia.  No recent weight loss.  No sodas, chocolates, chewing gums and candy.  No nonsteroidals.  No artificial sweeteners.  Past GI procedures: - CT AP 07/2012- neg - colon 04/2016 ([facility]) polyps s/p polypectomy, mild sigmoid diverticulosis. Bx- neg, neg random TI and colon biopsies. Past Medical History:  Diagnosis Date   Constipation    Depression    Essential hypertension    Family history of colon cancer    Hydrarthrosis    Hypertension    IBS (irritable bowel syndrome)    Obese    Sinus bradycardia    Spontaneous ecchymoses     Past Surgical History:  Procedure Laterality Date   ABDOMINAL HYSTERECTOMY     BREAST EXCISIONAL BIOPSY  1985   CHOLECYSTECTOMY     COLONOSCOPY  04/29/2016   Colonic polyp status post polypectomy. Minimal sigmoid diverticulosis.    TUBAL LIGATION      Family History  Problem Relation Age of Onset   Breast cancer Sister    Breast cancer Sister    Breast cancer Sister     Cancer Brother    Lung cancer Brother    Colon cancer Neg Hx    Esophageal cancer Neg Hx    Stomach cancer Neg Hx    Rectal cancer Neg Hx     Social History   Tobacco Use   Smoking status: Never   Smokeless tobacco: Never  Vaping Use   Vaping status: Never Used  Substance Use Topics   Alcohol use: No   Drug use: No    Current Outpatient Medications  Medication Sig Dispense Refill   amLODipine (NORVASC) 5 MG tablet Take 5 mg by mouth daily.     lisinopril-hydrochlorothiazide (PRINZIDE,ZESTORETIC) 20-12.5 MG per tablet Take 1 tablet by mouth daily.     Potassium Chloride  ER 20 MEQ TBCR TAKE 1 TABLET BY MOUTH DAILY. PLEASE CALL [phone] TO SCHEDULE AN OFFICE VISIT FOR MORE REFILL 30 tablet 3   Calcium Carb-Cholecalciferol (OYSTER SHELL CALCIUM W/D) 500-5 MG-MCG TABS Take 1 tablet by mouth.     clotrimazole-betamethasone (LOTRISONE) cream Apply 1 Application topically 2 (two) times daily as needed.     Vitamin D, Ergocalciferol, (DRISDOL) 1.25 MG (50000 UT) CAPS capsule Take 50,000 Units by mouth every 7 (seven) days.     Current Facility-Administered Medications  Medication Dose Route Frequency Provider Last Rate Last Admin   0.9 %  sodium chloride  infusion  500 mL Intravenous Once Charlanne Groom, MD        Allergies  Allergen Reactions   Penicillins Other (See Comments)    Pt states, I can't explain it, but I can't take it  Pt states that she had a hard time breathing    Review of Systems:  Constitutional: Denies fever, chills, diaphoresis, appetite change and has fatigue.  HEENT: Denies photophobia, eye pain, redness, hearing loss, ear pain, congestion, sore throat, rhinorrhea, sneezing, mouth sores, neck pain, neck stiffness and tinnitus.   Respiratory: Denies SOB, DOE, cough, chest tightness,  and wheezing.   Cardiovascular: Denies chest pain, palpitations and leg swelling.  Genitourinary: Denies dysuria, urgency, frequency, hematuria, flank pain and  difficulty urinating.  Musculoskeletal: has myalgias, back pain, joint swelling, arthralgias and gait problem.  Skin: No rash.  Neurological: Denies dizziness, seizures, syncope, weakness, light-headedness, numbness and headaches.  Hematological: Denies adenopathy. Easy bruising, personal or family bleeding history  Psychiatric/Behavioral: has anxiety or depression     Physical Exam:    BP (!) 147/77   Pulse (!) 51   Temp 97.9 F (36.6 C) (Skin)   Ht 5' 1 (1.549 m)   Wt 170 lb (77.1 kg)   SpO2 98%   BMI 32.12 kg/m  Filed Weights   06/17/24 0833  Weight: 170 lb (77.1 kg)   Constitutional:  Well-developed, in no acute distress. Psychiatric: Normal mood and affect. Behavior is normal. HEENT: Pupils normal.  Conjunctivae are normal. No scleral icterus. Neck supple.  Cardiovascular: Normal rate, regular rhythm. No edema Pulmonary/chest: Effort normal and breath sounds normal. No wheezing, rales or rhonchi. Abdominal: Soft, nondistended. LLQ tenderness. Bowel sounds active throughout. There are no masses palpable. No hepatomegaly. Rectal:  defered Neurological: Alert and oriented to person place and time. Skin: Skin is warm and dry. No rashes noted.  Data Reviewed: I have personally reviewed following labs and imaging studies  From 01/05/2019: Positive ANA 1 is to 160, uric acid 6.3, sed rate 9, negative RA screen, B12 280, TSH 1.1, iron studies ferritin 271, iron 91, transferrin 251, saturation 26%.  CBC showed hemoglobin 13.4, MCV 90, platelets 197, low vitamin D 12, urinalysis negative   [name], MD 06/17/2024, 9:02 AM  Cc: Jackolyn Darice BROCKS, FNP   "

## 2024-06-17 NOTE — Patient Instructions (Addendum)
 Resume previous diet Continue present medications Await pathology results Repeat colonoscopy in 5 years Handouts on diverticulosis, hemorrhoids given  YOU HAD AN ENDOSCOPIC PROCEDURE TODAY AT THE [facility]:   Refer to the procedure report that was given to you for any specific questions about what was found during the examination.  If the procedure report does not answer your questions, please call your gastroenterologist to clarify.  If you requested that your care partner not be given the details of your procedure findings, then the procedure report has been included in a sealed envelope for you to review at your convenience later.  YOU SHOULD EXPECT: Some feelings of bloating in the abdomen. Passage of more gas than usual.  Walking can help get rid of the air that was put into your GI tract during the procedure and reduce the bloating. If you had a lower endoscopy (such as a colonoscopy or flexible sigmoidoscopy) you may notice spotting of blood in your stool or on the toilet paper. If you underwent a bowel prep for your procedure, you may not have a normal bowel movement for a few days.  Please Note:  You might notice some irritation and congestion in your nose or some drainage.  This is from the oxygen used during your procedure.  There is no need for concern and it should clear up in a day or so.  SYMPTOMS TO REPORT IMMEDIATELY:  Following lower endoscopy (colonoscopy or flexible sigmoidoscopy):  Excessive amounts of blood in the stool  Significant tenderness or worsening of abdominal pains  Swelling of the abdomen that is new, acute  Fever of 100F or higher   For urgent or emergent issues, a gastroenterologist can be reached at any hour by calling [phone]. Do not use MyChart messaging for urgent concerns.    DIET:  We do recommend a small meal at first, but then you may proceed to your regular diet.  Drink plenty of fluids but you should avoid alcoholic  beverages for 24 hours.  ACTIVITY:  You should plan to take it easy for the rest of today and you should NOT DRIVE or use heavy machinery until tomorrow (because of the sedation medicines used during the test).    FOLLOW UP: Our staff will call the number listed on your records the next business day following your procedure.  We will call around 7:15- 8:00 am to check on you and address any questions or concerns that you may have regarding the information given to you following your procedure. If we do not reach you, we will leave a message.     If any biopsies were taken you will be contacted by phone or by letter within the next 1-3 weeks.  Please call us  at [phone] if you have not heard about the biopsies in 3 weeks.    SIGNATURES/CONFIDENTIALITY: You and/or your care partner have signed paperwork which will be entered into your electronic medical record.  These signatures attest to the fact that that the information above on your After Visit Summary has been reviewed and is understood.  Full responsibility of the confidentiality of this discharge information lies with you and/or your care-partner.

## 2024-06-17 NOTE — Progress Notes (Signed)
 Called to room to assist during endoscopic procedure.  Patient ID and intended procedure confirmed with present staff. Received instructions for my participation in the procedure from the performing physician.

## 2024-06-17 NOTE — Progress Notes (Signed)
 Report given to PACU, vss

## 2024-06-18 ENCOUNTER — Telehealth: Payer: Self-pay

## 2024-06-18 NOTE — Telephone Encounter (Signed)
" °  Follow up Call-     06/17/2024    8:34 AM  Call back number  Post procedure Call Back phone  # [phone]  Permission to leave phone message Yes     Patient questions:  Do you have a fever, pain , or abdominal swelling? No. Pain Score  0 *  Have you tolerated food without any problems? Yes.    Have you been able to return to your normal activities? Yes.    Do you have any questions about your discharge instructions: Diet   No. Medications  No. Follow up visit  No.  Do you have questions or concerns about your Care? No.  Actions: * If pain score is 4 or above: No action needed, pain <4.   "

## 2024-06-21 LAB — SURGICAL PATHOLOGY
